# Patient Record
Sex: Male | Born: 1980 | Race: White | Hispanic: No | Marital: Married
Health system: Midwestern US, Community
[De-identification: ages and names within clinical notes are randomized; demographics above are authoritative.]

## PROBLEM LIST (undated history)

## (undated) DIAGNOSIS — R7303 Prediabetes: Secondary | ICD-10-CM

## (undated) DIAGNOSIS — E119 Type 2 diabetes mellitus without complications: Principal | ICD-10-CM

## (undated) DIAGNOSIS — M5126 Other intervertebral disc displacement, lumbar region: Secondary | ICD-10-CM

## (undated) DIAGNOSIS — R1013 Epigastric pain: Secondary | ICD-10-CM

## (undated) DIAGNOSIS — K219 Gastro-esophageal reflux disease without esophagitis: Secondary | ICD-10-CM

## (undated) DIAGNOSIS — M48062 Spinal stenosis, lumbar region with neurogenic claudication: Secondary | ICD-10-CM

## (undated) DIAGNOSIS — G4489 Other headache syndrome: Secondary | ICD-10-CM

## (undated) DIAGNOSIS — R42 Dizziness and giddiness: Secondary | ICD-10-CM

## (undated) DIAGNOSIS — Z0000 Encounter for general adult medical examination without abnormal findings: Secondary | ICD-10-CM

---

## 2008-09-20 NOTE — Progress Notes (Signed)
Subjective:      Patient ID: Bobby Hall is a 28 y.o. male.    HPI Comments: Bobby Hall is a 28 y.o. male who presents with cough and high fever.. Symptom onset has been acute for a time period of 4 day(s). Severity is described as moderate to severe. Course of his symptoms over time is acute.        Review of Systems   Constitutional: Positive for fever, chills, appetite change and fatigue.   HENT: Positive for ear pain and sneezing.    Eyes: Negative.    Respiratory: Positive for cough, chest tightness and shortness of breath. Negative for wheezing.    Cardiovascular: Positive for chest pain. Negative for palpitations.   Gastrointestinal: Negative for vomiting and diarrhea.   Genitourinary: Positive for decreased urine volume. Negative for dysuria, hematuria and difficulty urinating.   Musculoskeletal: Negative for myalgias.   Neurological: Positive for headaches. Negative for dizziness.   Hematological: Negative.    Psychiatric/Behavioral: Negative.        Objective:   Physical Exam   Constitutional: He appears well-developed and well-nourished.   HENT:   Head: Normocephalic.   Right Ear: External ear normal.   Left Ear: External ear normal.   Neck: Normal range of motion.   Cardiovascular: Normal rate and regular rhythm.    Pulmonary/Chest: He has decreased breath sounds in the right upper field and the right middle field. He has rales in the right middle field.   Abdominal: Soft.   Musculoskeletal: Normal range of motion.   Skin: Skin is warm.       Assessment:      Encounter Diagnosis   Name Primary?   ??? Pneumonia Yes       See above      Plan:      .        Encounter Diagnosis   Name Primary?   ??? Pneumonia Yes       X-ray for pneumonia. Given 7 tabs of avelox 400mg  1 QD

## 2008-09-28 LAB — AMYLASE: Amylase: 23 U/L — ABNORMAL LOW (ref 25–115)

## 2008-09-28 LAB — HEPATIC FUNCTION PANEL
ALT: 29 U/L (ref 10–40)
AST: 24 U/L (ref 15–37)
Albumin: 4.8 g/dL (ref 3.4–5.0)
Alkaline Phosphatase: 68 U/L
Bilirubin, Direct: 0.24 mg/dL (ref 0.0–0.3)
Bilirubin, Indirect: 0.6 mg/dL (ref 0.0–1.0)
Total Bilirubin: 0.8 mg/dL (ref 0.0–1.0)
Total Protein: 7.6 g/dL (ref 6.4–8.2)

## 2008-09-28 LAB — CBC
Hematocrit: 46.1 % (ref 40.5–52.5)
Hemoglobin: 15.6 g/dL (ref 13.5–17.5)
MCH: 29 pg (ref 26–34)
MCHC: 33.8 g/dL (ref 31–36)
MCV: 85.9 fl (ref 80–100)
MPV: 7.2 fl (ref 5.0–10.5)
Platelets: 230 10*3 (ref 135–450)
RBC: 5.37 10*6 (ref 4.2–5.9)
RDW: 13.7 % (ref 11.5–14.5)
WBC: 10.6 10*3 (ref 4.0–11.0)

## 2008-09-28 LAB — POCT URINALYSIS DIPSTICK
Bilirubin, UA: NEGATIVE
Blood, UA POC: NEGATIVE
Glucose, UA POC: NEGATIVE
Ketones, UA: NEGATIVE
Leukocytes, UA: NEGATIVE
Nitrite, UA: NEGATIVE
Spec Grav, UA: 1.01
Urobilinogen, UA: NEGATIVE
pH, UA: 6

## 2008-09-28 LAB — COMPREHENSIVE METABOLIC PANEL
ALT: 29 U/L (ref 10–40)
AST: 24 U/L (ref 15–37)
Albumin/Globulin Ratio: 1.8 (ref 1.1–2.2)
Albumin: 4.8 g/dL (ref 3.4–5.0)
Alkaline Phosphatase: 68 U/L
BUN: 12 mg/dL (ref 7–18)
CO2: 26 meq/L (ref 21–32)
Calcium: 9.9 mg/dL (ref 8.3–10.6)
Chloride: 106 meq/L (ref 99–110)
Creatinine: 1 mg/dL (ref 0.9–1.3)
GFR Est, African/Amer: >60
GFR, Estimated: >60 (ref >60)
Glucose: 119 mg/dL — ABNORMAL HIGH (ref 70–99)
Potassium: 4 meq/L (ref 3.5–5.1)
Sodium: 142 meq/L (ref 136–145)
Total Bilirubin: 0.8 mg/dL (ref 0.0–1.0)
Total Protein: 7.6 g/dL (ref 6.4–8.2)

## 2008-09-28 LAB — LIPASE: Lipase: 19.92 U/L (ref 5.6–51.3)

## 2008-09-28 NOTE — ED Notes (Unsigned)
[facility]     - [facility]  [address] [place] [id number]    EMERGENCY DEPARTMENT REPORT    NAME:                   Bobby Hall, Bobby Hall        ACCT NUMBER:                [id number]  GENDER:                 M                     PATIENT TYPE:             EMERG  AGE:                    28                    ROOM NUMBER:              ED  DATE OF BIRTH:          12/04/1980            MRN:                      [id number]      DISCHARGE DATE:         09/28/2008            FAMILY PHYS:              Bobby Hall, M.D.      CHIEF COMPLAINT:  Nausea, vomiting, and cephalgia.  HISTORY OF PRESENT ILLNESS:  The patient is a 28      -year-old white male,    who about 3 weeks  ago, had some diarrhea  ;   was diagnosed with a viral syndrome.  He had some   fluids .     T hen he  said he was [place] a [place] within the last couple of days , h e had developed some   c hest congestion at  times.  Denies any purulent sputum production.  He said he started feeling   sick.  He said he  started having a headache on Monday.  The patient states he has lost about 15   pounds from  vomiting this week.  Patient was on Avelox most recently.  Patient did have   Phenergan but he still  has been nauseated with this.  PAST MEDICAL HISTORY:  Remarkable for prior back surgery and hand surgery in   1995.  MEDICATIONS:  Vicodin and Phenergan.  ALLERGIES:  None.  SOCIAL HISTORY:  The patient is a no   nsmoker, denies alcohol excess, denies   any drugs of  abuse.  REVIEW OF SYSTEMS:  The patient complains of cephalgia, bifrontal, temporal in   nature and  retroorbital.  Denies cough or sputum production.  Denies sinus drainage.    Denies any sore  throat.    Denies  melena, hematochezia, or hematemesis.  All remaining   systems are reviewed by  me and are negative.  Nursing notes reviewed by me and are unremarkable.  PHYSICAL EXAMINATION:  HEENT:  The patient does have some photophobia.  He has no percussion   tenderness     to the  sinuses  however.  LUNGS:  Clear to P  and  A.  No E - to - A changes.  HEART:  Regular rate and rhythm without murmurs, gallops, or rubs.  ABDOMEN:  Soft.    Active bowel sounds times 4.  Nontender.  No masses,   guarding, rebound, or  peritoneal signs.  EXTRE   MITIES:  Unremarkable.  NEUROLOGIC:  No focal deficits or lateralizing signs.  BACK:  No thoracolumbar tenderness noted.  SKIN:  Warm and dry.  No cutaneous exanthem seen.  MENTAL STATUS:  Normal.  PSYCHIATRIC:  Normal.  MEDICAL DECISION MAKING/DI       AGNOSTIC DATA:  The patient's urinalysis is   negative.  No  bacteria, pyuria, or hematuria seen of significance.  LABORATORY DATA:        Shows a white count of 11,100 which is normal,   hemoglobin 15.9,  hematocrit 46.2, platelet count 214,000, which is also normal .   The patient   had glucose of   110 ,  BUN   11, creatinine   1.0.    Sodium is   139, potassium   4.1, chloride     104 , serum CO2   28 .  ED CLINICAL COURSE:       Unremarkable.  He was given  Zosyn  here, Dilaudid   and  Zofran .  He  feels better but he still has some cephalgia.   The p atient was given  normal   saline hydration   as  well.  FINAL DIAGNOSIS:     Acute cephalgia.  CRITICAL CARE:  None.  PROCEDURES:  None.  DISPOSITION:  The patient is discharged home   .  C ontinued on Augmentin   empirically, as well as  Zofran.  He has Vicodin at  home.  He is to follow up with his primary care   physician in the office in  the next 3 to 5 days or return if symptoms change or worsen.  Calton Golds, MD  electronic Signature  10/09/08 13:33      [facility]  -[facility]  [address] [place] [address]    EMERGENCY DEPARTMENT REPORT    NAME:              Bobby Hall, Bobby Hall NUMBER:          [id number]  GENDER:            M                 PATIENT TYPE:         EMERG  AGE:               28                ROOM NUMBER:          ED  DATE OF BIRTH:     1980-03-15        MRN:                  [id number]    Bobby Hall,  M.D.      Karene Fry Danie Binder  SOAP Job #:   [id number]  SOAP Document #:   [id number]  Dict:   09/28/2008 23:55:00  Tran:   09/29/2008 07:21:49

## 2008-09-28 NOTE — Progress Notes (Signed)
Pharmacy called [place] regarding prescription.

## 2008-09-28 NOTE — Patient Instructions (Addendum)
Clear liquid diet x 24 hrs, then BRAT  Vicodin for HA pain  Phenergan 25 mg tabs every 6 hrs as needed for nausea/vomiting  Blood work here today- we will call you with results Monday  Go to the Emergency Room for any worsening symptoms  Neti Pot at bedtime and as needed. Patient instructed on use of Neti Pot (Handout given)    Dehydration   GENERAL INFORMATION:   What is dehydration? Dehydration (dee-heye-DRAY-shen) is a condition that happens when the amount of water in the body is lower than normal. Normally, the body has the right amount of water inside and outside of the cells. Water and electrolytes (mineral salts) are usually in balance in the body. This balance is important to keep your body working properly. With dehydration, electrolyte levels may be increased or decreased. This may cause serious effects, such as your kidneys and other organs to not work properly.  What cause dehydration? Dehydration may be caused by not drinking enough water, losing too much fluid, or both. Any of the following may increase your chance of having dehydration:   ?? Advanced age with decreased ability to sense thirst or to concentrate urine.    ?? Being in the sun or heat for too long, or sweating a lot, such as when you exercise.    ?? Diseases, such as stroke, diabetes, heart problems, or infections.    ?? Medicines that cause you to lose water and salt, such as diuretics (water pills).    ?? Vomiting (throwing up), diarrhea, or fever that lasts a long time.   What are the signs and symptoms of dehydration? You may have any of the following:   ?? Dry eyes or mouth.    ?? Headache, dizziness, or confusion (cannot think clearly).    ?? Increased thirst.    ?? Irregular or fast breathing, fast or pounding heartbeat, and low blood pressure.    ?? Passing little or no urine, or constipation (dry, hard bowel movement).    ?? Sudden weight loss.    ?? Tiredness or body weakness.   How is dehydration diagnosed? Your caregiver will take a  detailed health history from you. This includes information on other health conditions you have that may lead to dehydration. Caregivers may also ask about past surgeries, or the medicines that you use or have used. You may need any of the following:   ?? Physical exam: Your caregiver will do a complete check-up of your body to look for problems or signs of infection.     ?? Blood tests: You may need blood taken for tests. The blood can be taken from a blood vessel in your hand, arm, or the bend in your elbow. It is tested to see how your body is doing. It can give your caregivers more information about your health condition. You may need to have blood drawn more than once.     ?? BUN and creatinine: Caregivers will measure the levels of blood urea nitrogen (BUN) and creatinine in your body. This will tell caregivers if too much water is being lost or retained in your body.    ?? Tilt table test: This test checks to see what happens to your heartbeat and your blood pressure when you change positions.    ?? Urinalysis: This test that looks at your urine to find out how your kidneys are working. Caregivers look for white blood cells, protein, sugar, blood, and bacteria that are not normally in urine.  They may also check the amount of water in your body by checking the concentration of your urine.   How is dehydration treated? You may have any of the following:   ?? Oral rehydration therapy: This therapy replaces lost body fluids by having you drink plenty of liquids to avoid dehydration. You may also drink an oral rehydration solution (ORS). An ORS has the right amounts of water, salts, and sugar you need to replace body fluids.    ?? Intravenous therapy: You may receive fluid through a tube placed in your vein. Lost electrolytes (mineral salts) may also be included in the fluid.    ?? Hypodermoclysis: This is also called clysis. This treatment quickly gives your body a large amount of water. The water is given into the  deepest layer of your skin, which helps regulate body temperature.    ?? Other treatments: Treating other conditions, which may have caused your dehydration, is also important. If you are using medicines that put you at risk of dehydration, they may have to be stopped. An electrolyte imbalance may also be corrected.   The earlier dehydration is treated, the better your chance of recovery. With treatment, such as fluid replacement, you may fully recover from dehydration.   Where can I find more information? Contact the following for more information:   ?? [employer]  [address] [address]  [place], [place] [address]  Phone: [phone]  Web Address: http://www.https://powers.com/  CARE AGREEMENT:   You have the right to help plan your care. To help with this plan, you must learn about your health condition and how it may be treated. You can then discuss treatment options with your caregivers. Work with them to decide what care may be used to treat you. You always have the right to refuse treatment.   Copyright ?? 2009. [employer]. All rights reserved. Information is for End User's use only and may not be sold, redistributed or otherwise used for commercial purposes.  The above information is an educational aid only. It is not intended as medical advice for individual conditions or treatments. Talk to your doctor, nurse or pharmacist before following any medical regimen to see if it is safe and effective for you.Acute Headache   GENERAL INFORMATION:   What is a headache? A headache is pain or discomfort in the head. A headache may have a known cause, or the cause may be unknown. Many people get headaches. Both men and women can get headaches, but women more often have headaches that are moderately or very painful. Headaches occur most often in middle aged adults, and less often in older adults.  What are the most common types of headaches?   ?? Tension-type headache: This is the most common type of  headache. These headaches may occur during the day, typically in the late afternoon, and may go away by the evening. The pain is usually mild or moderate, and you may have problems tolerating bright light or loud noise. The pain is usually felt across the forehead or in the back of the head, and it may be only on one side. These headaches may occur every day.     ?? Migraine headache: Migraine headaches are usually recurring and cause moderate or severe pain. The headaches generally last from 1 to 3 days. Pain tends to be on only one side of the head, and may change sides. The pain is often located in the temple or the back of the head,  and it may feel throbbing. The pain may also be behind your eye, and feel sharp and steady.    ?? Migraine with aura: An aura is something that you see or feel, and occurs before a headache. People with an aura before their migraine headache often see a small spot surrounded by bright zigzag lines. Other signs or symptoms may follow the aura.     ?? Cluster headache: The pain of a cluster headache is usually on one side of the head. It often causes severe pain, and can last for 30 minutes to two hours. These headaches may occur once or twice each day. These headaches occur more often at night, and may wake you from sleeping.   What causes headaches? Headaches may be a symptom of a wide variety of conditions including the following:   ?? Infections: This includes infections caused by a virus, bacteria or other germ.    ?? Metabolic disorders: This includes thyroid gland and other disorders.     ?? Neoplasms: These are also called growths or tumors.    ?? Posttraumatic disorders: After head trauma, such as a blow to the head, a headache may occur.    ?? Primary headache disorders: These problems may be from muscle contractions or changes in the way that your body feels, controls, or responds to pain.    ?? Structural disorders: These problems may involve nerves such as cranial nerves or the  trigeminal nerve. Meninges, blood vessels, muscles, soft tissues, skin, bones, or joints may also cause headaches. Tooth, neck or sinus pain may also cause headaches.    ?? Toxins: Being around strong-smelling chemicals or other substances may cause headaches.   Other factors that may cause or be related to headaches include:   ?? Caffeine: If you are used to drinking 2 to 3 cups of coffee each day, suddenly stopping may cause a headache.     ?? Diet: Cured meats, artificial sweeteners, products that contain tyramine (such as red wine, aged cheese, and dark chocolate), and monosodium glutamate (MSG) may trigger headaches.    ?? Drinking alcohol: Drinks that contain alcohol such as beer, wine, vodka and other hard liquor may cause headaches.    ?? Fasting: Not eating for a period of time may cause a headache.     ?? Fatigue: Tiredness can cause tension-type headaches to occur.     ?? Menstruation cycles: Menstruation may cause headaches, especially for teenagers or after a pregnancy. Headaches are also more likely when you use oral contraceptives (birth control pills) or estrogen replacement therapy.    ?? Sleep deprivation: Not getting enough sleep can cause tension-type headaches, and changes in your usual sleep pattern can cause a migraine headache. Napping during the daytime may also cause headaches.    ?? Stress: Tension or stress may cause headaches. Migraine headaches caused by stress may go away after relaxing. Headaches caused by stress may occur hours or even days after stressful events.   How can I tell caregivers about my headache?   ?? A pain scale may be used to help you tell caregivers how bad the pain is.              ?? Headaches may be acute, lasting for hours or days. Headaches may also be subacute, lasting for days or weeks. Headaches may also be chronic, lasting for months or years. Headaches can occur in one or more places, anywhere in the head.    ?? Headaches may be  mild or moderately painful. It may be  hard to describe exactly where the pain is, and the headache may feel like pressure in or on your head. These headaches are usually caused by muscle problems such as contractions (cramps) or injuries.    ?? Headaches may be moderately or very painful. You may be able to state exactly where the pain is. These headaches usually are behind your eye or in your forehead, and are a throbbing pain. This type of headache is usually caused by problems with blood vessels in the head.   What are signs and symptoms that may be related to my headache?   ?? Fever.    ?? Loss of memory, responses to events that seem incorrect, and trouble solving easy mathematical questions.     ?? Nausea (feeling sick to your stomach) and vomiting (throwing up).    ?? Problems with your vision:    ?? Drooping eyelids, watery eyes, or red eyes.     ?? Losing your ability to see to the sides, up or down.    ?? Seeing strange designs or sights right before the headache occurs.    ?? Trouble tolerating bright light.     ?? Unable to see as clearly or as well as usual.     ?? Runny nose.     ?? Stiff neck.    ?? Tenderness of the head and neck area.     ?? Trouble staying awake, or being less alert than usual.     ?? Weakness or decreased energy.   How might my headache be treated?   ?? Treatment for a headache depends on the cause of the headache, if the cause is known. If there is a medical condition causing your headache, treatment will be aimed at that condition. If the cause of your headache is unknown, treatment is aimed at stopping or preventing the headache.    ?? Treatment to stop the headache may be done for headaches that cause moderate or severe pain. OTC pain medicine may be suggested for treatment of occasional tension-type headaches. If headaches greatly impact your daily activities, certain treatments do not help, and for other factors, you may need one or more of the following treatments to prevent or treat a headache:    ?? Analgesics: This medicine  may be used to treat tension-type headaches.     ?? Antidepressants: This medicine may help decrease how often and how long you have tension-type headaches.    ?? Biofeedback: Biofeedback is a special way to control how your body reacts to things like stress or pain. The first step in this training is to use electrodes (wires) to monitor your body responses. These electrodes are placed on different parts of your body, such as your chest. The electrodes are attached to a TV-type monitor which gives a paper tracing of your heart beating. You will learn how to control body changes, such as slowing your heart rate, when you become upset. Thermal biofeedback measures heat. When this is used with relaxation, certain headaches may be prevented. Electromyographic biofeedback measures muscle tension. This treatment may be used with other treatments to prevent headaches.    ?? Cognitive - behavior therapy: Also called stress management, this therapy may be used with other measures to prevent headaches.   What can I do to help prevent or treat my headaches?   ?? Applying heat to the area increases blood circulation and may relieve a headache. Place a warm compress  or a heating pad on the painful area. A warm compress is a small towel dampened with hot water and placed in a plastic bag. Wrap a towel around the plastic bag to prevent burns. Keep a heating pad on a low heat setting. Do not sleep on a heating pad because it can cause a bad burn.    ?? You may want to keep a headache diary. Record the dates and times that you get headaches, and what you were doing before the headache started. This might help you learn if there is something that triggers your headaches.    ?? Try lying down in a comfortable position and closing your eyes. Relax your muscles slowly, starting at the toes and working your way up your body.   Will I need appointments with other caregivers?   ?? You may need to see a dentist for a temporomandibular joint  condition (TMD). If you are an older adult and temporal arteritis is suspected, you may need a procedure called a temporal artery biopsy. You may need to see an ophthalmologist if headaches occur after close-up work such as reading, or if your headaches may be caused by another condition related to your eyesight.    ?? You may need to see a psychiatrist if you have anxiety or depression, or a gynecologist if you use birth control pills or estrogen replacement therapy. If sudden and serious conditions that may require surgery are suspected, you may need to see a neurosurgeon. You may need to see a neurologist if you have other symptoms with your headache, or caregivers find problems during an exam. If you have long-lasting or long-term headaches or treatment does not work to prevent or relieve your headaches, you may need to see a headache specialist.   What should I expect with time or treatment? Relief from headaches depends on the type of headache, treatments used, and other factors. Migraine headaches may improve after age 78. Headaches that occur in relation to the menstrual cycle may be less likely to improve over time. Headaches caused by conditions such as temporal arteritis and subdural hematoma may slowly improve. There may be lasting symptoms such as vision loss or memory loss if headaches are caused by a subdural hematoma or other medical condition.   Call your caregiver if you have any of the following:   ?? A continuous headache with vomiting and signs of dehydration.    ?? A daily headache with pain that is not relieved by medicine and other treatments.     ?? Changes in your headaches, or new symptoms that occur when you have a headache.    ?? Headaches after having surgery, or after being told that you have another medical condition.    ?? Severe headache pain that does not go away even after using medicine.   Seek immediate help by calling 911 if you have any of the following:   ?? A headache that occurs  after a blow to the head, a fall, or other trauma.     ?? Changes in personality, forgetfulness, or other mental changes with your headache.    ?? Loss of feeling on one side of your face or body.    ?? Severe headache, neck stiffness, vomiting and photophobia (pain with bright light).     ?? Unbearable headache pain.   CARE AGREEMENT:   You have the right to help plan your care. To help with this plan, you must learn about your health condition  and how it may be treated. You can then discuss treatment options with your caregivers. Work with them to decide what care may be used to treat you. You always have the right to refuse treatment.   Copyright ?? 2009. [employer]. All rights reserved. Information is for End User's use only and may not be sold, redistributed or otherwise used for commercial purposes.  The above information is an educational aid only. It is not intended as medical advice for individual conditions or treatments. Talk to your doctor, nurse or pharmacist before following any medical regimen to see if it is safe and effective for you.

## 2008-09-28 NOTE — Progress Notes (Signed)
Subjective:      Patient ID: Bobby Hall is a 28 y.o. male.    HPI  States had stomach flu x 3 weeks ago with diarrhea and became so dehydrated he had to go get IV fluids. Reports Nausea and vomiting started x 5 days ago associated with HA. Thought it would get better, but did not. 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   Review of Systems   Constitutional: Negative.  Negative for fever and chills.   HENT: Positive for sinus pressure. Negative for sore throat.    Eyes: Negative.    Respiratory: Negative.  Negative for cough.    Cardiovascular: Negative.    Gastrointestinal: Positive for nausea, abdominal pain and constipation. Negative for diarrhea (not lately).   Genitourinary: Negative.    Musculoskeletal: Negative.    Skin: Negative.  Negative for rash.   Neurological: Positive for light-headedness and headaches. Negative for dizziness and numbness.   Hematological: Negative.    Psychiatric/Behavioral: Negative.        Objective:   Physical Exam   Nursing note and vitals reviewed.  Constitutional: He is oriented to person, place, and time. He appears well-developed and well-nourished.  Non-toxic appearance. He does not have a sickly appearance. He appears ill. He appears distressed.   HENT:   Right Ear: Tympanic membrane is not injected and not erythematous. A middle ear effusion is present.   Left Ear: Tympanic membrane is not injected and not erythematous. A middle ear effusion is present.   Nose: Right sinus exhibits frontal sinus tenderness. Right sinus exhibits no maxillary sinus tenderness. Left sinus exhibits frontal sinus tenderness. Left sinus exhibits no maxillary sinus tenderness.   Mouth/Throat: Oropharyngeal exudate present. No posterior oropharyngeal edema or posterior oropharyngeal erythema.   Cardiovascular: Normal rate and regular rhythm.    Pulmonary/Chest: Effort normal and breath sounds normal.   Abdominal: Soft. Bowel sounds are increased.  Tenderness is present in the left lower quadrant. He has no rigidity, no rebound and no CVA tenderness.   Musculoskeletal: Normal range of motion.   Neurological: He is alert and oriented to person, place, and time.   Skin: Skin is warm, dry and intact.   Psychiatric: He has a normal mood and affect.       Assessment:      1. Dehydration (276.51)  CBC, COMPREHENSIVE METABOLIC PANEL, HEPATIC FUNCTION PANEL, POCT URINALYSIS DIPSTICK   2. HEADACHE (784.0)  CBC, COMPREHENSIVE METABOLIC PANEL   3. Nausea & Vomiting (787.01H)  CBC, COMPREHENSIVE METABOLIC PANEL, AMYLASE, LIPASE   4. LLQ Abdominal Pain (789.04C)  CBC, COMPREHENSIVE METABOLIC PANEL, HEPATIC FUNCTION PANEL, POCT URINALYSIS DIPSTICK, AMYLASE, LIPASE               Plan:      Clear liquid diet x 24 hrs, then BRAT  Vicodin for HA pain  Phenergan 25 mg tabs every 6 hrs as needed for nausea/vomiting  Blood work here today- we will call you with results Monday  Go to the Emergency Room for any worsening symptoms  Neti Pot at bedtime and as needed. Patient instructed on use of Neti Pot (Handout given)

## 2008-10-02 ENCOUNTER — Telehealth

## 2008-10-02 NOTE — Telephone Encounter (Signed)
Continues to have dizziness ,head and ears feeling full. Not able to stay up for long periods of time due to this dizziness. Patient spoke to Dr. Selena Batten. Told to increase fluids, [facility] [place] [facility] per verbal order of Dr. Selena Batten.

## 2008-10-26 LAB — POCT URINALYSIS DIPSTICK
Bilirubin, UA: NEGATIVE
Blood, UA POC: NEGATIVE
Glucose, UA POC: NEGATIVE
Ketones, UA: NEGATIVE
Leukocytes, UA: NEGATIVE
Nitrite, UA: NEGATIVE
Spec Grav, UA: 1.02
Urobilinogen, UA: 0.2
pH, UA: 6

## 2008-10-26 NOTE — Patient Instructions (Signed)
Verbal instructions given as follows:    Lab work  F/U GI Referral (Dr. Josem Kaufmann)  Albuterol as needed (Rx given)  Pulmonary Function Test -(Rx given)  Naproxen 500 mg twice a day with meals (Rx given)

## 2008-10-26 NOTE — Progress Notes (Signed)
Subjective:      Patient ID: Bobby Hall is a 28 y.o. male.    Chest Pain   This is a new problem. The current episode started 1 to 4 weeks ago (x 3 weeks ago. Thought pulled a muscle and that it would go away- it hasn't. States cleaned out a corn silo with mold on 09-14-08 and hasn't been right since.). The onset quality is sudden. The problem occurs constantly. The problem has been gradually worsening. The pain is present in the lateral region. The pain is at a severity of 5/10. The pain is moderate. The quality of the pain is described as stabbing. The pain radiates to the right shoulder. Associated symptoms include abdominal pain (after eating), a cough, diaphoresis (night sweats), exertional chest pressure, malaise/fatigue, nausea, near-syncope, shortness of breath and weakness. Pertinent negatives include no back pain, dizziness, fever, headaches, hemoptysis, irregular heartbeat, orthopnea, palpitations, sputum production, syncope or vomiting. The cough's precipitants include allergen exposure (mold from cleaning out the silo?). The cough is non-productive. Cough relieved by: breathing treatment of albuterol.       Review of Systems   Constitutional: Positive for malaise/fatigue, diaphoresis (night sweats), appetite change and fatigue. Negative for fever and chills.   HENT: Negative.  Negative for ear pain, congestion, rhinorrhea and sinus pressure.    Eyes: Negative.    Respiratory: Positive for cough, chest tightness and shortness of breath. Negative for hemoptysis and sputum production.    Cardiovascular: Positive for chest pain. Negative for palpitations, orthopnea and syncope.   Gastrointestinal: Positive for nausea and abdominal pain (after eating). Negative for vomiting, diarrhea (loose stools) and constipation.        "Smelly burps"     Genitourinary: Negative.    Musculoskeletal: Negative.  Negative for back pain.   Skin: Negative.  Negative for rash.   Neurological: Positive for weakness and  light-headedness. Negative for dizziness and headaches.   Hematological: Negative.  Negative for adenopathy.   Psychiatric/Behavioral: Negative.  Negative for sleep disturbance.   All other systems reviewed and are negative.        Objective:   Physical Exam   Nursing note and vitals reviewed.  Constitutional: He appears well-developed and well-nourished. He does not appear ill. He appears distressed.   HENT:   Head: Normocephalic.   Right Ear: Tympanic membrane is not injected and not erythematous. A middle ear effusion (R>L) is present.   Left Ear: Tympanic membrane is not injected and not erythematous. A middle ear effusion is present.   Nose: No rhinorrhea. Right sinus exhibits no maxillary sinus tenderness and no frontal sinus tenderness. Left sinus exhibits no maxillary sinus tenderness and no frontal sinus tenderness.   Cardiovascular: Normal rate, regular rhythm and normal heart sounds.    Pulmonary/Chest: Effort normal. No respiratory distress. He has decreased breath sounds. He has no wheezes. He has no rhonchi. He has no rales. He exhibits tenderness (right sided).          Abdominal: Soft. Normal appearance and bowel sounds are normal. There is no organomegaly. Tenderness is present in the left lower quadrant. He has no rebound, no guarding and no CVA tenderness. No hernia.   Lymphadenopathy:     He has no cervical adenopathy.     He has no axillary adenopathy.        Right axillary: No pectoral and no lateral adenopathy present.        Left axillary: No pectoral and no lateral adenopathy present.  Assessment:      1. Chest Pain (786.7F)  PULMONARY FUNCTION TEST   2. Cough (786.2)  albuterol (PROVENTIL;VENTOLIN) 90 MCG/ACT inhaler   3. Abdominal Pain (789.00AP)  AMB REFERRAL TO GASTROENTEROLOGY, CBC, COMPREHENSIVE METABOLIC PANEL, POCT URINALYSIS DIPSTICK   4. Fatigue (780.79B)  TSH   5. Lipid Screening (V77.91D)  LIPID PANEL   6. Screening PSA (Prostate Specific Antigen) (V76.44C)  PSA SCREENING              Plan:      Lab work  F/U GI Referral (Dr. Josem Kaufmann)  Albuterol as needed (Rx given)  Pulmonary Function Test - Rx given  Naproxen 500 mg twice a day with meals

## 2008-10-27 LAB — COMPREHENSIVE METABOLIC PANEL
ALT: 37 U/L (ref 10–40)
AST: 25 U/L (ref 15–37)
Albumin/Globulin Ratio: 1.7 (ref 1.1–2.2)
Albumin: 4.5 g/dL (ref 3.4–5.0)
Alkaline Phosphatase: 57 U/L
BUN: 9 mg/dL (ref 7–18)
CO2: 25 meq/L (ref 21–32)
Calcium: 10 mg/dL (ref 8.3–10.6)
Chloride: 105 meq/L (ref 99–110)
Creatinine: 0.9 mg/dL (ref 0.9–1.3)
GFR Est, African/Amer: >60
GFR, Estimated: >60 (ref >60)
Glucose: 89 mg/dL (ref 70–99)
Potassium: 3.8 meq/L (ref 3.5–5.1)
Sodium: 142 meq/L (ref 136–145)
Total Bilirubin: 0.8 mg/dL (ref 0.0–1.0)
Total Protein: 7.2 g/dL (ref 6.4–8.2)

## 2008-10-27 LAB — CBC
Hematocrit: 44.6 % (ref 40.5–52.5)
Hemoglobin: 15 g/dL (ref 13.5–17.5)
MCH: 29.4 pg (ref 26–34)
MCHC: 33.6 g/dL (ref 31–36)
MCV: 87.7 fl (ref 80–100)
MPV: 8.7 fl (ref 5.0–10.5)
Platelets: 191 10*3 (ref 135–450)
RBC: 5.09 10*6 (ref 4.2–5.9)
RDW: 14.6 % — ABNORMAL HIGH (ref 11.5–14.5)
WBC: 5.7 10*3 (ref 4.0–11.0)

## 2008-10-27 LAB — LIPID PANEL
Cholesterol, Total: 177 mg/dl (ref <200)
HDL: 39 mg/dL — ABNORMAL LOW (ref 40–60)
LDL Calculated: 119 mg/dl — ABNORMAL HIGH (ref <100)
Triglycerides: 97 mg/dl (ref <150)
VLDL Cholesterol Calculated: 19 mg/dl

## 2008-10-27 LAB — PSA SCREENING: PSA: 0.89 ng/ml

## 2008-10-27 LAB — TSH: TSH: 3.21 u[IU]/mL (ref 0.35–5.5)

## 2008-11-08 NOTE — Progress Notes (Signed)
Subjective:      Patient ID: Bobby Hall is a 28 y.o. male.    HPI Comments: Bobby Hall is a 28 y.o. male who presents with chest pain sharp and sensitive swollen. Symptom onset has been acute for a time period of 2 week(s). Severity is described as severe. Course of his symptoms over time is acute and abrupt.no injury and sign of infection..        Review of Systems   Constitutional: Negative.  Negative for fever.   HENT: Positive for ear pain.    Eyes: Negative.    Respiratory: Positive for shortness of breath. Negative for cough and wheezing.    Cardiovascular: Positive for chest pain.   Gastrointestinal: Negative.    Genitourinary: Negative.    Musculoskeletal: Positive for back pain.   Skin: Negative.    Neurological: Negative.    Hematological: Negative.    Psychiatric/Behavioral: Negative.        Objective:   Physical Exam   Constitutional: He appears well-developed and well-nourished.   HENT:   Mouth/Throat: No oropharyngeal exudate.   Eyes: Conjunctivae and extraocular motions are normal. Pupils are equal, round, and reactive to light. No scleral icterus.   Neck: Normal range of motion. No thyromegaly present.   Cardiovascular: Normal rate, regular rhythm and intact distal pulses.    Pulmonary/Chest: Effort normal. He has no wheezes. He exhibits tenderness.   Abdominal: Soft. He exhibits no mass. No tenderness.   Musculoskeletal: Normal range of motion. He exhibits no edema.   Lymphadenopathy:     He has no cervical adenopathy.   Neurological: He is alert. No cranial nerve deficit.   Skin: Skin is warm.   Psychiatric: His behavior is normal.       Assessment:      Encounter Diagnosis   Name Primary?   . Chest wall pain Yes             Plan:      Pain medicines for 2 weeks. Chest and rib x-rays if not better in 2-3 weeks.

## 2008-12-11 NOTE — Progress Notes (Signed)
Subjective:      Patient ID: Bobby Hall is a 28 y.o. male.    HPI Comments: Bobby Hall is a 28 y.o. male who presents with pain rt peri and infla scapular are.. Symptom onset has been chronic for a time period of 3 month(s). Severity is described as moderate to severe. Course of his symptoms over time is chronic and abrupt. Burning and sharp pain radiating to front. Pain is not worse with movements.referral to last 2 visits..        Review of Systems   Constitutional: Negative.    HENT: Negative for hearing loss, ear pain and sinus pressure.    Eyes: Negative.    Respiratory: Negative.  Negative for apnea, cough, choking and stridor.    Cardiovascular: Negative.    Gastrointestinal: Negative.    Genitourinary: Negative.  Negative for hematuria and penile swelling.   Musculoskeletal: Negative for back pain.   Skin: Negative.  Negative for rash, color change and pallor.        Rt side of mid of back is numb and loss of sense. More dull to pin pricking and touch.   Neurological: Negative.  Negative for dizziness, tremors, seizures and syncope.   Hematological: Negative.  Does not bruise/bleed easily.   Psychiatric/Behavioral: Negative for confusion, dysphoric mood and decreased concentration.       Objective:   Physical Exam   Constitutional: He appears well-developed and well-nourished.   HENT:   Mouth/Throat: No oropharyngeal exudate.   Eyes: Conjunctivae and extraocular motions are normal. Pupils are equal, round, and reactive to light. No scleral icterus.   Neck: Normal range of motion. No thyromegaly present.   Cardiovascular: Normal rate, regular rhythm and intact distal pulses.    Pulmonary/Chest: Effort normal. He has no wheezes. He exhibits no tenderness.   Abdominal: Soft. He exhibits no mass. No tenderness.   Musculoskeletal: Normal range of motion. He exhibits no edema.        Arms:    Lymphadenopathy:     He has no cervical adenopathy.   Neurological: He is alert. No cranial nerve deficit.      Skin: Skin is warm.   Psychiatric: His behavior is normal.       Assessment:      Encounter Diagnosis   Name Primary?   . Radicular neuropathy Yes             Plan:      See above.

## 2008-12-24 NOTE — Telephone Encounter (Signed)
Referred to neurologist for rt side radiculopathy, but other side finding of MRI.

## 2008-12-24 NOTE — Telephone Encounter (Signed)
Patient is asking for the results of his MRI done on 12-19-2008 at [facility].  Please call patients wife(Bobby Hall) [phone] ext [phone].  Below are the results.      MRI THORACIC SPINE WITHOUT CONTRAST   INDICATION- RADICULOPATHY   TECHNIQUE- Multiplaner, multisequence MR images were obtained   without contrast.   COMPARISON- None   FINDINGS-   The vertebral levels are labeled on the T2 sagittal sequence.   There is normal height, alignment, and signal intensity of the   vertebral bodies. There is normal signal and configuration of the   visualized spinal cord. The visualized soft tissues surrounding   the spine are unremarkable.   At the T3-T4 level a small left paracentral disc protrusion   results in mild left paracentral canal stenosis. A focus of high   T2 signal within the disc protrusion suggests an annular tear.   No other disc herniation or spinal stenosis is identified.   IMPRESSION-   Small left paracentral disc protrusion at T3-T4 which results in   mild left paracentral canal stenosis.   Read ByIdelle Jo M.D.   Released By- Idelle Jo M.D.   Released Date Time- 12/19/08 0981   Transcriptionist- Riki Rusk D Pete Pelt.D.

## 2008-12-25 NOTE — Telephone Encounter (Signed)
Patient is requesting his pain medication called to [facility] [place].  He is actually requesting for something stronger.  He has appointment with Dr. Kizzie Bane (which I faxed over MRI results).

## 2008-12-25 NOTE — Telephone Encounter (Signed)
Give him vicodin for pain. Not percocet. Can have 30 tabs till sees Dr. Kizzie Bane.

## 2008-12-25 NOTE — Telephone Encounter (Signed)
Called [facility] [place]  PATIENT HAS REFILLS AT PHARMACY.

## 2009-01-24 NOTE — Procedures (Unsigned)
PATIENT NAME                    PA #              MR #               Bobby Hall, [place]                 [id number]        [id number]         ATTENDING PHYSICIAN                      DATE         DIS DATE        Glee Arvin, MD                       01/23/2009   01/23/2009      DATE OF BIRTH      AGE             PATIENT TYPE       RM #            10-22-1980         28              OPQ                                   REFERRING PHYSICIAN:  Dr. Glee Arvin and Dr. Anselm Pancoast.     Routine spirometry indicates that there is no significant  obstruction/limitation to airflow present in the large airways with a normal  FEV1/FVC ratio of 82%.  The absolute FEV1 of 5.4 L is normal at 108% of  predicted and the forced vital capacity of 6.6 L is normal at 107% of  predicted.  There is no change in airflow following bronchodilators.  Overall  ventilatory reserve as reflected in the MBV is normal at 95% of predicted.   The patient's static lung volumes are normal with a total lung capacity of 8  L which is 99% of predicted.  There are marked obesity related reduction and  selected static lung volumes.  Gas exchange inefficiency as reflected in the  diffusion capacity is normal at 106% of predicted.     OVERALL IMPRESSION:  These are completely normal pulmonary function studies  with no obstructive or restrictive pulmonary defects, normal ventilatory  reserve, normal gas exchange inefficiency, and no change in airflow following  bronchodilators.  There are marked obesity induced changes in selected static  lung volumes.                                Romilda Joy, MD     [id number]  DD: 01/24/2009 07:58  DT: 01/24/2009 09:10  Job #: [id number]  CC: Anselm Pancoast, MD  CC: Glee Arvin, MD

## 2009-03-01 LAB — POCT URINALYSIS DIPSTICK
Bilirubin, UA: NEGATIVE
Blood, UA POC: NEGATIVE
Glucose, UA POC: NEGATIVE
Ketones, UA: NEGATIVE
Leukocytes, UA: NEGATIVE
Nitrite, UA: NEGATIVE
Protein, UA POC: NEGATIVE
Spec Grav, UA: 1035
Urobilinogen, UA: 0.2
pH, UA: 6

## 2009-03-01 NOTE — Progress Notes (Signed)
Here for DOT physical exam. No complaints. Takes Vicodin as needed for back pain issues (herniated discs with surgery) prescribed by Dr. 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  See scanned DOT PE form.  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

## 2010-08-19 NOTE — Progress Notes (Signed)
Subjective:      Patient ID: Bobby Hall is a 30 y.o. male.    Otalgia   There is pain in both (lt is worse) ears. This is a new problem. The current episode started in the past 7 days. The problem occurs constantly. The problem has been gradually worsening. There has been no fever. The pain is moderate. Pertinent negatives include no ear discharge, headaches, hearing loss, rhinorrhea or vomiting. He has tried nothing for the symptoms. The treatment provided no relief. There is no history of hearing loss.       Review of Systems   Constitutional: Positive for fatigue. Negative for fever.   HENT: Positive for ear pain and voice change. Negative for hearing loss, rhinorrhea, sinus pressure and ear discharge.         [Started voice changes first and ear ache.  Eyes: Negative.    Respiratory: Negative.  Negative for apnea, choking and stridor.    Cardiovascular: Negative.    Gastrointestinal: Negative for nausea and vomiting.   Genitourinary: Negative for hematuria, discharge and penile swelling.   Musculoskeletal: Negative for myalgias and back pain.   Skin: Negative for color change and pallor.   Neurological: Negative for dizziness, tremors, seizures, syncope and headaches.   Hematological: Does not bruise/bleed easily.   Psychiatric/Behavioral: Negative for confusion, dysphoric mood and decreased concentration.       Objective:   Physical Exam   Constitutional: He appears well-developed and well-nourished.   HENT:   Mouth/Throat: No oropharyngeal exudate.         full of wax lt ear   Eyes: Conjunctivae and EOM are normal. Pupils are equal, round, and reactive to light. No scleral icterus.   Neck: Normal range of motion. No thyromegaly present.        Lt side cervical l-n is tender.   Cardiovascular: Normal rate, regular rhythm and intact distal pulses.    Pulmonary/Chest: Effort normal. He has no wheezes. He exhibits no tenderness.   Abdominal: Soft. He exhibits no mass. There is no tenderness.    Musculoskeletal: Normal range of motion. He exhibits no edema.   Lymphadenopathy:     He has cervical adenopathy.   Neurological: He is alert. No cranial nerve deficit.   Skin: Skin is warm.   Psychiatric: His behavior is normal.       Assessment:      Encounter Diagnosis   Name Primary?   ??? Cerumen impaction Yes           Plan:      Removed wax from lt ear with syringed water.

## 2010-09-23 NOTE — Patient Instructions (Signed)
Low fat diet.

## 2010-09-23 NOTE — Telephone Encounter (Signed)
i have ordered protonix instead of nexium.

## 2010-09-23 NOTE — Progress Notes (Signed)
Subjective:      Patient ID: Bobby Hall is a 30 y.o. male.    GI Problem  The primary symptoms include fatigue, abdominal pain, nausea and diarrhea. Primary symptoms do not include fever, weight loss, vomiting, melena, hematemesis or hematochezia. Episode onset: more than 6 m ago. The problem has been gradually worsening.   The fatigue began more than 1 week ago. The fatigue has been worsening since its onset.   The abdominal pain has been gradually worsening since its onset. The abdominal pain is located in the epigastric region. The abdominal pain radiates to the epigastric region. The abdominal pain is relieved by nothing.   The illness is also significant for back pain. Associated medical issues do not include liver disease, alcohol abuse or bowel resection.       Review of Systems   Constitutional: Positive for fatigue. Negative for fever and weight loss.   HENT: Negative for hearing loss, ear pain and sinus pressure.    Eyes: Negative.    Respiratory: Negative.  Negative for apnea, choking and stridor.    Cardiovascular: Negative.    Gastrointestinal: Positive for nausea, abdominal pain and diarrhea. Negative for vomiting, melena, hematochezia and hematemesis.   Genitourinary: Negative.  Negative for hematuria, discharge and penile swelling.   Musculoskeletal: Positive for back pain.   Skin: Negative for color change and pallor.   Neurological: Negative.  Negative for dizziness, tremors, seizures and syncope.   Hematological: Does not bruise/bleed easily.   Psychiatric/Behavioral: Negative for confusion, sleep disturbance, dysphoric mood and decreased concentration. The patient is not nervous/anxious.        Objective:   Physical Exam   Constitutional: He appears well-developed and well-nourished.   HENT:   Mouth/Throat: No oropharyngeal exudate.   Eyes: Conjunctivae and EOM are normal. Pupils are equal, round, and reactive to light. No scleral icterus.   Neck: Normal range of motion. No thyromegaly  present.   Cardiovascular: Normal rate, regular rhythm and intact distal pulses.    Pulmonary/Chest: Effort normal. He has no wheezes. He exhibits no tenderness.   Abdominal: Soft. He exhibits no mass. There is tenderness.        Epigastric tenderness.   Musculoskeletal: Normal range of motion. He exhibits no edema.   Lymphadenopathy:     He has no cervical adenopathy.   Neurological: He is alert. No cranial nerve deficit.   Skin: Skin is warm.   Psychiatric: His behavior is normal.       Assessment:      Encounter Diagnoses   Name Primary?   . Gallbladder stone with nonacute cholecystitis Yes   . GERD (gastroesophageal reflux disease)    . IBS (irritable bowel syndrome)            Plan:      See above.

## 2010-09-23 NOTE — Telephone Encounter (Signed)
[facility] [place] states NEXIUM 40 mg one daily needs PA.  [facility] [phone]  Plan states to try with Omeprazole or Pantoprazole.  Please advise.

## 2010-12-04 LAB — POCT URINALYSIS DIPSTICK
Bilirubin, UA: NEGATIVE
Blood, UA POC: NEGATIVE
Glucose, UA POC: NEGATIVE
Ketones, UA: NEGATIVE
Leukocytes, UA: NEGATIVE
Nitrite, UA: NEGATIVE
Protein, UA POC: NEGATIVE
Spec Grav, UA: 1.01
Urobilinogen, UA: NEGATIVE
pH, UA: 7

## 2010-12-04 LAB — COMPREHENSIVE METABOLIC PANEL
ALT: 69 U/L — ABNORMAL HIGH (ref 10–40)
AST: 41 U/L — ABNORMAL HIGH (ref 15–37)
Albumin/Globulin Ratio: 1.8 (ref 1.1–2.2)
Albumin: 4.6 gm/dl (ref 3.4–5.0)
Alkaline Phosphatase: 66 U/L (ref 45–129)
BUN: 14 mg/dl (ref 7–18)
CO2: 31 mEq/L (ref 21–32)
Calcium: 9.9 mg/dl (ref 8.3–10.6)
Chloride: 106 mEq/L (ref 99–110)
Creatinine: 1 mg/dl (ref 0.9–1.3)
GFR Est, African/Amer: >60
GFR, Estimated: >60 (ref >60)
Glucose: 93 mg/dl (ref 70–99)
Potassium: 4.7 mEq/L (ref 3.5–5.1)
Sodium: 143 mEq/L (ref 136–145)
Total Bilirubin: 0.5 mg/dl (ref 0.0–1.0)
Total Protein: 7.2 gm/dl (ref 6.4–8.2)

## 2010-12-04 LAB — CBC
Hematocrit: 47.1 % (ref 40.5–52.5)
Hemoglobin: 15.9 gm/dl (ref 13.5–17.5)
MCH: 29 pg (ref 26–34)
MCHC: 33.8 gm/dl (ref 31–36)
MCV: 85.8 fl (ref 80–100)
MPV: 9.1 fl (ref 5.0–10.5)
Platelets: 203 10*3 (ref 135–450)
RBC: 5.49 10*6 (ref 4.2–5.9)
RDW: 13.6 % (ref 11.5–14.5)
WBC: 6 10*3 (ref 4.0–11.0)

## 2010-12-04 LAB — LIPID PANEL
Cholesterol, Total: 218 mg/dl — ABNORMAL HIGH (ref <200)
HDL: 49 mg/dl (ref 40–60)
LDL Calculated: 137 mg/dl — ABNORMAL HIGH (ref <100)
Triglycerides: 162 mg/dl — ABNORMAL HIGH (ref <150)
VLDL Cholesterol Calculated: 32 mg/dl

## 2010-12-04 LAB — TSH: TSH: 2.84 u[IU]/mL (ref 0.35–5.5)

## 2010-12-04 NOTE — Progress Notes (Signed)
Subjective:      Patient ID: Bobby Hall is a 30 y.o. male.    HPI Comments: Patient presents with:    3 Month Follow-Up - Patient states that he is here for a 3 month follow up on some Bloating, Burping and Diarrhea. Patient states that since starting on Protonix patient states that all those sx's have subsided.           Review of Systems   Constitutional: Positive for unexpected weight change. Negative for fatigue.   HENT: Negative.  Negative for hearing loss, ear pain and sinus pressure.    Eyes: Negative.    Respiratory: Negative.  Negative for apnea, choking and stridor.    Cardiovascular: Negative.    Genitourinary: Positive for frequency. Negative for hematuria and penile swelling.   Musculoskeletal: Positive for back pain.   Skin: Negative.  Negative for color change and pallor.   Neurological: Negative.  Negative for dizziness, tremors, seizures and syncope.   Hematological: Does not bruise/bleed easily.   Psychiatric/Behavioral: Negative.  Negative for confusion, dysphoric mood and decreased concentration.       Objective:   Physical Exam   Constitutional: He appears well-developed and well-nourished.   HENT:   Mouth/Throat: No oropharyngeal exudate.   Eyes: Conjunctivae and EOM are normal. Pupils are equal, round, and reactive to light. No scleral icterus.   Neck: Normal range of motion. No thyromegaly present.   Cardiovascular: Normal rate, regular rhythm and intact distal pulses.    Pulmonary/Chest: Effort normal. He has no wheezes. He exhibits no tenderness.   Abdominal: Soft. He exhibits no mass. There is no tenderness.   Musculoskeletal: Normal range of motion. He exhibits no edema.   Lymphadenopathy:     He has no cervical adenopathy.   Neurological: He is alert. No cranial nerve deficit.   Skin: Skin is warm.   Psychiatric: His behavior is normal.       Assessment:      Encounter Diagnosis   Name Primary?   Marland Kitchen NASH (nonalcoholic steatohepatitis) Yes           Plan:      Referred to GI nd  weight control.

## 2011-01-22 NOTE — Patient Instructions (Signed)
Watch diet and lose weight.

## 2011-01-22 NOTE — Progress Notes (Signed)
Subjective:      Patient ID: Bobby Hall is a 30 y.o. male.    Gastrophageal Reflux  He complains of abdominal pain and belching. He reports no choking, no coughing, no heartburn or no hoarse voice. This is a chronic problem. The problem occurs frequently.       Review of Systems   Constitutional: Negative.    HENT: Negative for hearing loss, ear pain, hoarse voice and sinus pressure.    Eyes: Negative.    Respiratory: Negative.  Negative for apnea, cough, choking and stridor.    Gastrointestinal: Positive for abdominal pain. Negative for heartburn and diarrhea.   Genitourinary: Negative.  Negative for hematuria and penile swelling.   Musculoskeletal: Positive for back pain.   Skin: Negative.  Negative for color change and pallor.   Neurological: Negative for dizziness, tremors, seizures and syncope.   Hematological: Does not bruise/bleed easily.   Psychiatric/Behavioral: Positive for sleep disturbance and dysphoric mood. Negative for confusion and decreased concentration.       Objective:   Physical Exam   Constitutional: He appears well-developed and well-nourished.   HENT:   Mouth/Throat: No oropharyngeal exudate.   Eyes: Conjunctivae and EOM are normal. Pupils are equal, round, and reactive to light. No scleral icterus.   Neck: Normal range of motion. No thyromegaly present.   Cardiovascular: Normal rate, regular rhythm and intact distal pulses.    Pulmonary/Chest: Effort normal. He has no wheezes. He exhibits no tenderness.   Abdominal: Soft. He exhibits no mass. There is no tenderness.   Musculoskeletal: Normal range of motion. He exhibits no edema.   Lymphadenopathy:     He has no cervical adenopathy.   Neurological: He is alert. No cranial nerve deficit.   Skin: Skin is warm.   Psychiatric: His behavior is normal.       Assessment:      Encounter Diagnoses   Name Primary?   . GERD (gastroesophageal reflux disease) Yes   . Back pain            Plan:      Bobby Hall was seen today for gastrophageal  reflux.    Diagnoses and associated orders for this visit:    Gerd (gastroesophageal reflux disease)  - pantoprazole (PROTONIX) 40 MG tablet; Take 1 tablet by mouth daily (with breakfast).    Back pain

## 2011-01-23 LAB — FERRITIN: Ferritin: 184.7 ng/ml (ref 22–322)

## 2011-01-23 LAB — IRON AND TIBC
Iron Saturation: 19 % — ABNORMAL LOW (ref 20–50)
Iron: 57 ug/dl (ref 35–150)
TIBC: 305 ug/dl (ref 260–445)

## 2011-01-23 LAB — HEPATITIS B SURFACE ANTIGEN: Hepatitis B Surface Ag: NEGATIVE

## 2011-01-23 LAB — HEPATITIS C ANTIBODY: Hepatitis C Ab: NEGATIVE

## 2011-01-23 LAB — IGA: IgA, Serum: 196 mg/dl (ref 70–400)

## 2011-01-23 LAB — HEPATITIS B SURFACE ANTIBODY: Hep B S Ab: <2.8 m[IU]/mL — ABNORMAL LOW

## 2011-01-23 LAB — ANA

## 2011-01-24 LAB — CERULOPLASMIN: Ceruloplasmin: 22 mg/dl (ref 17–54)

## 2011-01-24 LAB — MITOCHONDRIAL ANTIBODIES, M2: Mitochondrial Ab: 7.3 units (ref 0.0–20.0)

## 2011-01-24 LAB — TISSUE TRANSGLUTAMINASE ANTIBODY IGA W/ REFLEX: Transglutaminase IgA: 4 AU (ref 0–19)

## 2011-01-24 LAB — F-ACTIN (SMOOTH MUSCLE) ANTIBODY W/ REFLEX TO TITER: F-Actin IgG: 5 units (ref 0–19)

## 2011-01-24 LAB — TISSUE TRANSGLUTAMINASE, IGG: Tissue Transglut Ab: 2 EU (ref 0–19)

## 2011-01-25 LAB — ALPHA-1-ANTITRYPSIN W/ PHENOTYPE: A-1 Antitrypsin: 115 mg/dl (ref 100–200)

## 2012-02-01 ENCOUNTER — Encounter

## 2012-02-08 LAB — POCT URINALYSIS DIPSTICK
Bilirubin, UA: NEGATIVE
Blood, UA POC: NEGATIVE
Glucose, UA POC: NEGATIVE
Ketones, UA: NEGATIVE
Leukocytes, UA: NEGATIVE
Nitrite, UA: NEGATIVE
Protein, UA POC: NEGATIVE
Spec Grav, UA: 1.025
Urobilinogen, UA: NEGATIVE
pH, UA: 6

## 2012-02-08 MED ORDER — PANTOPRAZOLE SODIUM 40 MG PO TBEC
40 MG | ORAL_TABLET | Freq: Every day | ORAL | Status: DC
Start: 2012-02-08 — End: 2012-04-07

## 2012-02-08 NOTE — Progress Notes (Signed)
Subjective:      Patient ID: Bobby Hall is a 32 y.o. male.    HPI Comments: Patient presents with:    Medication Refill - Patient states that Bobby Hall is here to get a refill on his Protonix.           Review of Systems   Constitutional: Negative.  Negative for appetite change.   HENT: Negative for hearing loss, ear pain and sinus pressure.    Eyes: Negative.  Negative for visual disturbance.   Respiratory: Negative.  Negative for apnea, choking and stridor.    Cardiovascular: Negative.    Gastrointestinal: Positive for diarrhea.        Gas and loose bowel if not use protonix.   Genitourinary: Positive for frequency. Negative for hematuria, discharge, penile swelling and penile pain.   Musculoskeletal: Positive for back pain.   Skin: Negative.  Negative for color change and pallor.   Neurological: Positive for numbness (herniated disc.). Negative for dizziness, tremors, seizures and syncope.   Hematological: Does not bruise/bleed easily.   Psychiatric/Behavioral: Negative.  Negative for confusion, dysphoric mood and decreased concentration.       Objective:   Physical Exam   Constitutional: Bobby Hall appears well-developed and well-nourished.   HENT:   Mouth/Throat: No oropharyngeal exudate.   Eyes: Conjunctivae and EOM are normal. Pupils are equal, round, and reactive to light. No scleral icterus.   Neck: Normal range of motion. No thyromegaly present.   Cardiovascular: Normal rate, regular rhythm and intact distal pulses.    Pulmonary/Chest: Effort normal. Bobby Hall has no wheezes. Bobby Hall exhibits no tenderness.   Abdominal: Soft. Bobby Hall exhibits no mass. There is no tenderness.   Musculoskeletal: Normal range of motion. Bobby Hall exhibits no edema.   Lymphadenopathy:     Bobby Hall has no cervical adenopathy.   Neurological: Bobby Hall is alert. No cranial nerve deficit.   Skin: Skin is warm.   Psychiatric: His behavior is normal.       Assessment:      Encounter Diagnoses   Name Primary?   ??? GERD (gastroesophageal reflux disease) Yes   ??? Back pain    ???  Urinary urgency            Plan:      Bobby Hall was seen today for medication refill.    Diagnoses and associated orders for this visit:    GERD (gastroesophageal reflux disease)  - pantoprazole (PROTONIX) 40 MG tablet; Take 1 tablet by mouth daily.    Back pain  - POCT Urinalysis no Micro    Other Orders  - gabapentin (NEURONTIN) 300 MG capsule; Take 300 mg by mouth 3 times daily.  - HYDROcodone-acetaminophen (LORTAB) 10-500 MG per tablet; Take 1 tablet by mouth every 6 hours as needed for Pain.

## 2012-04-07 NOTE — Progress Notes (Signed)
Subjective:      Patient ID: Bobby Hall is a 32 y.o. male.    HPI Comments: Patient presents with:    Finger Pain - Patient states that he started about 3 weeks ago with pain in his Left Index Finger that has ben increasing. Patient is only able to bend the finger about 1/2 way before it becomes painful.     Medial side of index finger is burning and hurt when ties shoe lace. No pain at joint. Lt elbow deformed.      Review of Systems   Constitutional: Negative.    HENT: Negative for hearing loss, ear pain and sinus pressure.    Eyes: Negative.    Respiratory: Negative for apnea, choking and stridor.    Genitourinary: Negative for hematuria, discharge, penile swelling and penile pain.   Musculoskeletal: Positive for back pain.   Skin: Negative for color change and pallor.   Neurological: Positive for numbness. Negative for dizziness, tremors, seizures and syncope.   Hematological: Does not bruise/bleed easily.   Psychiatric/Behavioral: Negative for confusion, dysphoric mood and decreased concentration.       Objective:   Physical Exam   Constitutional: He appears well-developed and well-nourished.   HENT:   Mouth/Throat: No oropharyngeal exudate.   Eyes: Conjunctivae and EOM are normal. Pupils are equal, round, and reactive to light. No scleral icterus.   Neck: Normal range of motion. No thyromegaly present.   Cardiovascular: Normal rate, regular rhythm and intact distal pulses.    Pulmonary/Chest: Effort normal. He has no wheezes. He exhibits no tenderness.   Abdominal: Soft. He exhibits no mass. There is no tenderness.   Musculoskeletal: Normal range of motion. He exhibits no edema.        Left elbow: He exhibits deformity.        Arms:  Lymphadenopathy:     He has no cervical adenopathy.   Neurological: He is alert. No cranial nerve deficit.   Skin: Skin is warm.   Psychiatric: His behavior is normal.       Assessment:      Encounter Diagnosis   Name Primary?   ??? Neuritis Yes            Plan:      Bobby Hall was  seen today for finger pain.    Diagnoses and associated orders for this visit:    Neuritis    Other Orders  - HYDROcodone-acetaminophen (NORCO) 10-325 MG per tablet; Take 1 tablet by mouth 4 times daily.        Possible from deformed elbow pintching nerve ending. Not from arthritis. Follow up in 2-4 weeks with EMG.

## 2012-07-27 NOTE — Telephone Encounter (Signed)
Please call Charlene [phone] ext [phone] regarding blood type on [place].   She is wanting to get Keontre's Blood Type.

## 2012-07-28 NOTE — Telephone Encounter (Signed)
Spoke with Bobby Hall, she is pregnant and was needing to know if we had a record of his Blood Type or if we could order the testing for him. I explained that we don't order that type of testing and that she may check with the Hospital where he has had surgeries and they would have a record of that on file.    Patient's wife may need to takes shots depending on his blood type. Her ob-gyn could order the testing but it may not be covered by his insurance.

## 2012-08-18 ENCOUNTER — Encounter

## 2012-08-23 NOTE — Progress Notes (Signed)
Subjective:      Patient ID: Bobby Hall is a 32 y.o. male.    HPI Comments: Patient presents with:  Pain: Patient states that he started back in mid Feb with off/on pain and numbness in his Left Hand, Fingers and Arm that seems to be getting worse. Patient states that the pain and numbness started back about 3 weeks ago. Not able to get Arm comfortable. Taking Norco and Gabapentin for a back Injury and that helps very little.  Otalgia: Patient states that he started with a Headache last night and woke up with pain in both ears with the Right Ear is worse.           Review of Systems   Constitutional: Negative for fatigue.   HENT: Positive for ear pain. Negative for hearing loss and congestion.    Respiratory: Negative.    Cardiovascular: Negative.    Gastrointestinal: Negative.    Musculoskeletal: Positive for back pain.   Neurological: Positive for numbness (lt hand 2nd and 3rd finger are numb and painful. wake him up in PM.).   Psychiatric/Behavioral: Positive for sleep disturbance.       Objective:   Physical Exam   Constitutional: He appears well-developed and well-nourished.   HENT:   Mouth/Throat: No oropharyngeal exudate.   Ear canal and membrane is clear.   Eyes: Conjunctivae and EOM are normal. Pupils are equal, round, and reactive to light. No scleral icterus.   Neck: Normal range of motion. No thyromegaly present.   Cardiovascular: Normal rate, regular rhythm and intact distal pulses.    Pulmonary/Chest: Effort normal. He has no wheezes. He exhibits no tenderness.   Abdominal: Soft. He exhibits no mass. There is no tenderness.   Musculoskeletal: Normal range of motion. He exhibits no edema.   Lymphadenopathy:     He has no cervical adenopathy.   Neurological: He is alert. No cranial nerve deficit.   + Tinel sign of lt wrist.   Skin: Skin is warm.   Psychiatric: His behavior is normal.       Assessment:      Encounter Diagnoses   Name Primary?   ??? CTS (carpal tunnel syndrome), left Yes   ??? Ear pain,  right             Plan:      Bobby Hall was seen today for pain and otalgia.    Diagnoses and associated orders for this visit:    CTS (carpal tunnel syndrome), left    Ear pain, right        On pain med and neurontin for neuritis of back. Ear pain can be controlled with pain med for back.

## 2012-08-29 NOTE — Procedures (Signed)
[facility] [address]  [place], [place] [address]  [phone]     Electrodiagnostic Medicine  Rehabilitation Services Department      Exam Date: 08/29/2012  Name: Truett, Mcfarlan. Exam Time: 3:24 PM  MR#: [id number] Patient ID: [id number]  Gender:  Date of Birth: October 02, 1980    Age: 32    Referring Physician: Anselm Pancoast M.D.       Clinical Problem:   Patient is a 31 yo WM with prior lumbar surgery over 10 yrs. ago who complains of left hand paresthesias with associated decreased grip strength.  Symptoms have increased past month.  PE reveals fair to good strength.  Reflexes are present and symmetric.      Summary:   1. NCS reveal moderate prolongation and diminution of left median sensory and motor distal evoked responses.    2. Needle EMG reveals polyphasic voluntary motor unit potentials with slight decreased recruitment within left C6 innervated muscles.  No muscle membrane irritability (sustained muscle denervation) is noted however.      Impression:   1. Moderate left carpal tunnel syndrome.  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  2. Probable superimposed mild left C6 radiculopathy.  Subacute reinnervation appears to be present in left C6 innervated muscles.   3. No evidence of diffuse peripheral neuropathy or other focal mononeuropathy involving the left upper extremity.      (Note:  If this report is being viewed under CHART REVIEW/PROCEDURES, table format can be better viewed using CHART REVIEW/NOTES/Note type: procedure)    Thank you.             Wesley Ducre B. Hal Hope, M.D.        Motor Nerve Conduction:            Nerve and Site   Lat  ms Amp  mV Segment   Dist  mm Lat Diff  ms CV   m/s   Median.L to Abductor pollicis brevis.L      Wrist 5.0 2.5 Abductor pollicis brevis-Wrist 80 5.0    Elbow 9.4 2.4 Wrist-Elbow 220 4.4 50   Ulnar.L to Abductor digiti minimi (manus).L      Wrist 2.8 8.6 Abductor digiti minimi (manus)-Wrist 80 2.8    Above elbow 7.9 8.4 Wrist-Above elbow 260 5.1  51     Nerve Lat ? ms Amp ? mV CV ? m/s  Median 4.2 4 50  Ulnar 4.2 4 50  Peroneal 5.5 2 40  Tibial 6.1 4 40       Sensory and Mixed Nerve Conduction:             Nerve and Site   Peak  Lat ms Amp  mV Segment   Lat Diff  ms Dist  mm   Median.L to Digit II (index finger).L      Wrist 4.9 10 Digit II (index finger)-Wrist  140   Ulnar.L to Digit V (little finger).L      Wrist 2.9 20 Digit V (little finger)-Wrist  140   Radial.L to Anatomical snuff box.L      Forearm 2.5 22 Anatomical snuff box-Forearm  100     Nerve Peak Lat ? ms Amp ? ??V   Median 3.6 20  Ulnar 3.7 20  Radial 2.8 10  Sural 4.0 5  Peroneal 3.5 5    Needle EMG Examination:      Muscle Insertion Spontaneous Activity Volutional MUAPs Comments    Activity Fibs PSW Fasc Other Effort  Recruit Dur Amp Poly     Deltoid.L Normal 0 0 None  Normal Normal Normal Normal None    Biceps brachii.L Normal 0 0 None  Normal Normal Normal Normal None    Triceps brachii.L Normal 0 0 None  Normal Normal Normal Normal None    Brachioradialis.L Normal 0 0 None  Normal Reduced Sl Incr Normal Few    Pronator teres.L Normal 0 0 None  Normal Reduced Sl Incr Normal Few    Extensor indicis proprius.L Normal 0 0 None  Normal Normal Normal Normal None    Abductor pollicis brevis.L Normal 0 0 None  Normal Normal Normal Normal None    1st dorsal interosseous.L Normal 0 0 None  Normal Normal Normal Normal None

## 2012-09-16 NOTE — Other (Unsigned)
[facility]      [address] [place] [id number]         EMERGENCY DEPARTMENT REPORT     NAME:                   Bobby Hall, Bobby Hall       ACCT NUMBER:                   [id number]   GENDER:                 M                       PATIENT TYPE:                  EMERG   AGE:                    32                      ROOM NUMBER:                   ED   DATE OF BIRTH:          1980/02/06              MRN:                           [id number]       DATE OF SERVICE:        09/16/2012              FAMILY PHYS:                   TAI Aquilla HackerWON KIM, M.D.     CHIEF COMPLAINT: Back pain and numbness right leg.   HISTORY OF PRESENT ILLNESS: The patient is a 32 year old male with a long   history of back   problems, radiculopathies, and surgeries. His symptoms always been in his   left leg. Over the last several   days, he has developed numbness from his right knee down to his foot and the   pain has been markedly   severe to the point where he has had to buy a walker to help him ambulate.   __________ spoken with his   pain management physician who is unable to see him and referred him to the   emergency department.   He has been taking his normal medications of Norco and gabapentin. There has   been no recent injury or   unusual activity that might have set this off. Nursing notes reviewed.   PAST MEDICAL AND SURGICAL HISTORY: Past medical history is otherwise   unremarkable.   MEDICATIONS: Include those listed above plus __________.   ALLERGIES: He is allergic to Avelox.   SOCIAL HISTORY: There is no use of tobacco, alcohol, or illicit drugs.   REVIEW OF SYSTEMS: There has been no fever, chills. Skin, no rash or easy   bruising. ENT, no   headache, sore throat, runny nose. Pulmonary, no coughing or wheezing. GI, no   vomiting. No loss of   bowel control. GU, no loss of bladder control. All other review of systems   are reviewed and negative.   PHYSICAL EXAMINATION:   VITAL SIGNS: Temperature 98, pulse 117,  respirations 20, pressure 157/103,   saturations 94%.  GENERAL: Male who seems quite uncomfortable. Prefers to stand, leaning on his   walker.   MUSCULOSKELETAL: Examination of the back shows mild tenderness over the L4,   L5 area. He is able   to walk with difficulty. There is no footdrop. I have a hard time examining   his strength or even his   reflexes. He has more severe pain when he lies down. Deep tendon reflexes   seen symmetrically   decreased.   TREATMENT: In the ER: I did give him Dilaudid 2 mg IM, Norflex __________ mg   IM, and Decadron 10   mg p.o. I did review his case on the [facility] website. Eventually I was able to   speak to the nurse   practitioner of his pain management physician. She went over his most recent   MRI report which was   done a couple months ago. It showed no significant nerve impingement on the   right side. At this point,   we agreed I could give this patient stronger analgesia as well as treat him   with some oral steroids. He   was sent home with a prescription for Medrol Dosepak and for Percocet #20. He   has an appointment to   see the pain management physician Tuesday, September 20, 2012, in 4 days. He is   to not take his Norco   while he is on the Percocet. The patient is much more comfortable when he   leaves and able to ambulate   much easier. He does have a driver.   CLINICAL IMPRESSION: At this point, the patient has worsening of his chronic   pain including a possible   new radicular component to it.   FINAL DIAGNOSIS: Exacerbation of chronic lumbar pain with new radicular   component.   PLAN: Disposition is home.   CONDITION: Good.   CONSULTATIONS: Were to the office of the pain management physician.   CRITICAL CARE: None.                                 DCTNAME                               RADCRED                               SIGNDATE   Waymon Amato, M.D.       SR[facility]/sts                            [facility]   [address] [place]  [address]     EMERGENCY DEPARTMENT REPORT     NAME:               Bobby Hall NUMBER:             [id number]   GENDER:             M                    PATIENT TYPE:            EMERG   AGE:  32                   ROOM NUMBER:             ED   DATE OF BIRTH:      Nov 10, 1980           MRN:                     [id number]     SOAP Job #: [id number]   SOAP Document #: [id number]   Dict: 09/16/2012 17:23   Tran: 09/16/2012 18:40

## 2012-11-28 NOTE — Progress Notes (Signed)
Subjective:      Patient ID: Bobby Hall is a 32 y.o. male.    HPI Comments: Bobby Hall is a 32 y.o. male with the following history as recorded in EpicCare:  Patient Active Problem List    GERD (gastroesophageal reflux disease)      Back Pain         Date Noted: 09/20/2008      Current Outpatient Prescriptions:  oxyCODONE-acetaminophen (PERCOCET) 10-325 MG per tablet, Take 1 tablet by mouth every 4 hours as needed for Pain., Disp: , Rfl:   gabapentin (NEURONTIN) 400 MG capsule, Take 400 mg by mouth 4 times daily., Disp: , Rfl:   pantoprazole (PROTONIX) 40 MG tablet, Take 1 tablet by mouth daily (with breakfast)., Disp: 90 tablet, Rfl: 3    No current facility-administered medications for this visit.    Allergies: Avelox  Past Medical History:    Pneumonia                                       14 years *    GERD (gastroesophageal reflux disease)                        Chronic back pain                                               Comment:Workers Comp    Back pain                                       09/20/2008       Comment:Workers Comp    Herniated disc                                                  Comment:Workers Comp  Past Surgical History:    BACK SURGERY                                                     Comment:Workers Comp    FINGER SURGERY                                                   Comment:rt ring finger  Review of patient's family history indicates:    High Blood Pressure            Mother                    Diabetes                       Mother  Comment: borderline    Diabetes                       Father                    Sudden Death                   Sister                    Substance Abuse                Sister                      Smoking Status: Never Smoker                      Smokeless Status: Never Used                        Alcohol Use: No              Patient presents with:  Pre-op Exam: Patient here for a Pre Op Exam and to get Clearence for  Surgery. Patient having Lumbar Laminectomt and Microdiscectomy @ L3-4 under General Anesthesia by Dr Gordy Savers @ [facility] on 12/05/12. Fax # [phone], [phone].           Review of Systems   Constitutional: Positive for fatigue. Negative for fever.   HENT: Positive for ear pain. Negative for hearing loss and sinus pressure.    Eyes: Negative.  Negative for visual disturbance.   Respiratory: Negative.  Negative for apnea, choking and stridor.    Cardiovascular: Negative.    Gastrointestinal: Negative.  Negative for constipation.   Genitourinary: Negative.  Negative for hematuria, penile swelling and penile pain.   Musculoskeletal: Positive for back pain.        Rt leg pain from the back and numb.   Skin: Negative for color change and pallor.   Neurological: Positive for weakness (rt leg is weak loss of balance.). Negative for dizziness, tremors, seizures and syncope.   Hematological: Does not bruise/bleed easily.   Psychiatric/Behavioral: Positive for dysphoric mood. Negative for confusion and decreased concentration. The patient is nervous/anxious.        Objective:   Physical Exam   Constitutional: He appears well-developed and well-nourished.   HENT:   Mouth/Throat: No oropharyngeal exudate.   Eyes: Conjunctivae and EOM are normal. Pupils are equal, round, and reactive to light. No scleral icterus.   Neck: Normal range of motion. No thyromegaly present.   Cardiovascular: Normal rate, regular rhythm and intact distal pulses.    Pulmonary/Chest: Effort normal. He has no wheezes. He exhibits no tenderness.   Abdominal: Soft. He exhibits no mass. There is no tenderness.   Musculoskeletal: Normal range of motion. He exhibits tenderness. He exhibits no edema.   Lymphadenopathy:     He has no cervical adenopathy.   Neurological: He is alert. He displays abnormal reflex. No cranial nerve deficit.   Rt leg is painful and sore. Weak and slow reflex.   Skin: Skin is warm.   Psychiatric: His  behavior is normal.       Assessment:      Encounter Diagnoses   Name Primary?   ??? Pre-op examination Yes   ??? Lumbar herniated disc  Plan:      Bobby Hall was seen today for pre-op exam.    Diagnoses and associated orders for this visit:    Pre-op examination    Lumbar herniated disc    Other Orders  - PROTIME-INR  - APTT  - CBC  - Basic Metabolic Panel        Medically clear to have back surgery.

## 2012-11-29 LAB — BASIC METABOLIC PANEL
BUN: 12 mg/dL (ref 7–20)
CO2: 26 mmol/L (ref 21–32)
Calcium: 9.8 mg/dL (ref 8.3–10.6)
Chloride: 103 mmol/L (ref 99–110)
Creatinine: 1 mg/dL (ref 0.9–1.3)
GFR African American: >60 (ref >60)
GFR Non-African American: >60 (ref >60)
Glucose: 115 mg/dL — ABNORMAL HIGH (ref 70–99)
Potassium: 4.1 mmol/L (ref 3.5–5.1)
Sodium: 143 mmol/L (ref 136–145)

## 2012-11-29 LAB — CBC WITH AUTO DIFFERENTIAL
Basophils %: 0.4 %
Basophils Absolute: 0 10*3/uL (ref 0.0–0.2)
Eosinophils %: 1.5 %
Eosinophils Absolute: 0.1 10*3/uL (ref 0.0–0.6)
Hematocrit: 43.7 % (ref 40.5–52.5)
Hemoglobin: 14.8 g/dL (ref 13.5–17.5)
Lymphocytes %: 26.9 %
Lymphocytes Absolute: 2.2 10*3/uL (ref 1.0–5.1)
MCH: 29.2 pg (ref 26.0–34.0)
MCHC: 33.9 g/dL (ref 31.0–36.0)
MCV: 86.1 fL (ref 80.0–100.0)
MPV: 9 fL (ref 5.0–10.5)
Monocytes %: 7.1 %
Monocytes Absolute: 0.6 10*3/uL (ref 0.0–1.3)
Neutrophils %: 64.1 %
Neutrophils Absolute: 5.3 10*3/uL (ref 1.7–7.7)
Platelets: 205 10*3/uL (ref 135–450)
RBC: 5.07 M/uL (ref 4.20–5.90)
RDW: 13.7 % (ref 12.4–15.4)
WBC: 8.2 10*3/uL (ref 4.0–11.0)

## 2012-11-29 LAB — PROTIME-INR
INR: 0.89 (ref 0.85–1.16)
Protime: 9.6 s (ref 9.1–12.6)

## 2012-11-29 LAB — APTT: aPTT: 32.2 s (ref 23.1–36.1)

## 2013-02-13 ENCOUNTER — Encounter

## 2013-02-13 MED ORDER — PANTOPRAZOLE SODIUM 40 MG PO TBEC
40 MG | ORAL_TABLET | Freq: Every day | ORAL | Status: DC
Start: 2013-02-13 — End: 2013-02-28

## 2013-02-13 NOTE — Telephone Encounter (Signed)
Informed the patient rx was sent to the pharmacy

## 2013-02-13 NOTE — Telephone Encounter (Signed)
Pt has an appt on 1/20 and pt wants to know if Dr Selena BattenKim would refill his pantoprazole (PROTONIX) 40 MG tablet since he has an appt. [facility] to [facility] Pl advise pt.

## 2013-02-27 LAB — COMPREHENSIVE METABOLIC PANEL
ALT: 71 U/L — ABNORMAL HIGH (ref 10–40)
AST: 36 U/L (ref 15–37)
Albumin/Globulin Ratio: 1.7 (ref 1.1–2.2)
Albumin: 4.5 g/dL (ref 3.4–5.0)
Alkaline Phosphatase: 77 U/L (ref 40–129)
Anion Gap: 13 (ref 3–16)
BUN: 12 mg/dL (ref 7–20)
CO2: 28 mmol/L (ref 21–32)
Calcium: 9.8 mg/dL (ref 8.3–10.6)
Chloride: 103 mmol/L (ref 99–110)
Creatinine: 0.8 mg/dL — ABNORMAL LOW (ref 0.9–1.3)
GFR African American: >60 (ref >60)
GFR Non-African American: >60 (ref >60)
Globulin: 2.7 g/dL
Glucose: 124 mg/dL — ABNORMAL HIGH (ref 70–99)
Potassium: 4.5 mmol/L (ref 3.5–5.1)
Sodium: 144 mmol/L (ref 136–145)
Total Bilirubin: 0.6 mg/dL (ref 0.0–1.0)
Total Protein: 7.2 g/dL (ref 6.4–8.2)

## 2013-02-27 LAB — LIPID PANEL
Cholesterol, Total: 186 mg/dL (ref 0–199)
HDL: 42 mg/dL (ref 40–60)
LDL Calculated: 114 mg/dL — ABNORMAL HIGH (ref <100)
Triglycerides: 152 mg/dL — ABNORMAL HIGH (ref 0–150)
VLDL Cholesterol Calculated: 30 mg/dL

## 2013-02-27 NOTE — Progress Notes (Signed)
Patient here for fasting annual blood work

## 2013-02-27 NOTE — Progress Notes (Signed)
Has appointment tomorrow.

## 2013-02-28 LAB — HEMOGLOBIN A1C
Hemoglobin A1C: 6.2 %
eAG: 131.2 mg/dL

## 2013-02-28 MED ORDER — PANTOPRAZOLE SODIUM 40 MG PO TBEC
40 MG | ORAL_TABLET | Freq: Every day | ORAL | Status: DC
Start: 2013-02-28 — End: 2015-03-04

## 2013-02-28 NOTE — Progress Notes (Signed)
Reviewed it with him today.

## 2013-02-28 NOTE — Progress Notes (Signed)
Subjective:      Patient ID: Bobby Hall is a 33 y.o. male.    HPI Comments: Patient presents with:  Annual Exam: Patient here for an Annual Exam, Medication Refill and to discuss his Lab Results.           Review of Systems   Constitutional: Positive for fatigue.   HENT: Positive for ear pain. Negative for hearing loss and sinus pressure.    Eyes: Negative.    Respiratory: Negative.  Negative for apnea, choking and stridor.    Gastrointestinal: Positive for abdominal pain.        On Protonix   Genitourinary: Negative.  Negative for hematuria, penile swelling and penile pain.   Musculoskeletal: Positive for back pain.   Skin: Negative.  Negative for color change and pallor.   Neurological: Positive for dizziness (related position of head.). Negative for tremors, seizures and syncope.   Hematological: Does not bruise/bleed easily.   Psychiatric/Behavioral: Negative for confusion, dysphoric mood and decreased concentration.       Objective:   Physical Exam   Constitutional: He appears well-developed and well-nourished.   HENT:   Mouth/Throat: No oropharyngeal exudate.   Eyes: Conjunctivae and EOM are normal. Pupils are equal, round, and reactive to light. No scleral icterus.   Neck: Normal range of motion. No thyromegaly present.   Cardiovascular: Normal rate, regular rhythm and intact distal pulses.    Pulmonary/Chest: Effort normal. He has no wheezes. He exhibits no tenderness.   Abdominal: Soft. He exhibits no mass. There is no tenderness.   Musculoskeletal: Normal range of motion. He exhibits no edema.   Lymphadenopathy:     He has no cervical adenopathy.   Neurological: He is alert. No cranial nerve deficit.   Skin: Skin is warm.   Psychiatric: His behavior is normal.       Assessment:      Encounter Diagnoses   Name Primary?   ??? Routine general medical examination at a health care facility Yes   ??? GERD (gastroesophageal reflux disease)    ??? DM (diabetes mellitus) (HCC)    ??? Back pain    ??? Fatty liver              Plan:      Bobby HuaDavid was seen today for annual exam.    Diagnoses and associated orders for this visit:    Routine general medical examination at a health care facility    GERD (gastroesophageal reflux disease)  - pantoprazole (PROTONIX) 40 MG tablet; Take 1 tablet by mouth daily (with breakfast).    DM (diabetes mellitus) (HCC)    Back pain    Fatty liver

## 2013-10-16 MED ORDER — DOXYCYCLINE HYCLATE 100 MG PO TABS
100 MG | ORAL_TABLET | Freq: Two times a day (BID) | ORAL | Status: AC
Start: 2013-10-16 — End: 2013-10-23

## 2013-10-16 NOTE — Progress Notes (Signed)
Subjective:      Patient ID: Bobby Hall is a 33 y.o. male.    HPI Comments: Patient presents with:  Pharyngitis: fever, sore throat, ear pain , swollen glands x 3 days.        Pharyngitis  This is a new problem. The current episode started in the past 7 days. The problem occurs constantly. Associated symptoms include anorexia, arthralgias, chills, coughing, a fever, headaches and myalgias. Pertinent negatives include no vomiting. Nothing aggravates the symptoms. He has tried acetaminophen and NSAIDs for the symptoms. The treatment provided mild relief.       Review of Systems   Constitutional: Positive for fever and chills.   HENT: Positive for postnasal drip and rhinorrhea. Negative for tinnitus.    Respiratory: Positive for cough.         Brown thick sputums.   Cardiovascular: Negative.    Gastrointestinal: Positive for anorexia. Negative for vomiting and diarrhea.   Genitourinary: Negative.    Musculoskeletal: Positive for myalgias and arthralgias.   Skin: Negative.    Neurological: Positive for headaches.       Objective:   Physical Exam   Constitutional: He appears well-developed and well-nourished.   HENT:   Mouth/Throat: No oropharyngeal exudate.   Small superficial ulcer lt side uvula of throat.   Eyes: Conjunctivae and EOM are normal. Pupils are equal, round, and reactive to light. No scleral icterus.   Neck: Normal range of motion. No thyromegaly present.   Cardiovascular: Normal rate, regular rhythm and intact distal pulses.    Pulmonary/Chest: Effort normal. He has no wheezes. He exhibits no tenderness.   Abdominal: Soft. He exhibits no mass. There is no tenderness.   Musculoskeletal: Normal range of motion. He exhibits no edema.   Lymphadenopathy:     He has no cervical adenopathy.   Neurological: He is alert. No cranial nerve deficit.   Skin: Skin is warm.   Psychiatric: His behavior is normal.       Assessment:      Encounter Diagnoses   Name Primary?   ??? URI (upper respiratory infection) Yes    ??? Aphthous ulcer             Plan:      Bobby Hall was seen today for pharyngitis.    Diagnoses and associated orders for this visit:    URI (upper respiratory infection)    Aphthous ulcer

## 2013-10-16 NOTE — Patient Instructions (Signed)
Please call your pharmacy if you need any refills of your medication(s).    Please call our office at [phone] if you don't hear from us about your test results.                                                                 Bring an accurate list of your medications with you at every appointment to ensure that we have the correct information.    Our office hours are: Monday - Friday 8:30 am- 5 pm    Phone lines turn on at 8:30 am

## 2014-06-07 ENCOUNTER — Telehealth

## 2014-06-07 ENCOUNTER — Encounter: Admit: 2014-06-07 | Discharge: 2014-06-07 | Payer: BLUE CROSS/BLUE SHIELD | Primary: Internal Medicine

## 2014-06-07 DIAGNOSIS — Z0000 Encounter for general adult medical examination without abnormal findings: Secondary | ICD-10-CM

## 2014-06-07 LAB — TSH: TSH: 3.76 u[IU]/mL (ref 0.27–4.20)

## 2014-06-07 LAB — CBC
Hematocrit: 46.7 % (ref 40.5–52.5)
Hemoglobin: 16 g/dL (ref 13.5–17.5)
MCH: 29 pg (ref 26.0–34.0)
MCHC: 34.2 g/dL (ref 31.0–36.0)
MCV: 84.7 fL (ref 80.0–100.0)
MPV: 8.6 fL (ref 5.0–10.5)
Platelets: 189 10*3/uL (ref 135–450)
RBC: 5.51 M/uL (ref 4.20–5.90)
RDW: 13.8 % (ref 12.4–15.4)
WBC: 7.1 10*3/uL (ref 4.0–11.0)

## 2014-06-07 LAB — COMPREHENSIVE METABOLIC PANEL
ALT: 58 U/L — ABNORMAL HIGH (ref 10–40)
AST: 41 U/L — ABNORMAL HIGH (ref 15–37)
Albumin/Globulin Ratio: 1.7 (ref 1.1–2.2)
Albumin: 4.7 g/dL (ref 3.4–5.0)
Alkaline Phosphatase: 79 U/L (ref 40–129)
Anion Gap: 16 (ref 3–16)
BUN: 14 mg/dL (ref 7–20)
CO2: 24 mmol/L (ref 21–32)
Calcium: 9.6 mg/dL (ref 8.3–10.6)
Chloride: 101 mmol/L (ref 99–110)
Creatinine: 0.8 mg/dL — ABNORMAL LOW (ref 0.9–1.3)
GFR African American: >60 (ref >60)
GFR Non-African American: >60 (ref >60)
Globulin: 2.7 g/dL
Glucose: 116 mg/dL — ABNORMAL HIGH (ref 70–99)
Potassium: 4.5 mmol/L (ref 3.5–5.1)
Sodium: 141 mmol/L (ref 136–145)
Total Bilirubin: 0.3 mg/dL (ref 0.0–1.0)
Total Protein: 7.4 g/dL (ref 6.4–8.2)

## 2014-06-07 LAB — LIPID PANEL
Cholesterol, Total: 230 mg/dL — ABNORMAL HIGH (ref 0–199)
HDL: 37 mg/dL — ABNORMAL LOW (ref 40–60)
Triglycerides: 416 mg/dL — ABNORMAL HIGH (ref 0–150)

## 2014-06-07 LAB — LDL CHOLESTEROL, DIRECT: LDL Direct: 134 mg/dL — ABNORMAL HIGH (ref <100)

## 2014-06-07 NOTE — Telephone Encounter (Signed)
Lab orders signed

## 2014-06-07 NOTE — Progress Notes (Signed)
Patient here for fasting blood work

## 2014-06-08 ENCOUNTER — Inpatient Hospital Stay: Attending: Physical Medicine & Rehabilitation | Primary: Internal Medicine

## 2014-06-08 ENCOUNTER — Ambulatory Visit
Admit: 2014-06-08 | Discharge: 2014-06-08 | Payer: BLUE CROSS/BLUE SHIELD | Attending: Internal Medicine | Primary: Internal Medicine

## 2014-06-08 ENCOUNTER — Ambulatory Visit: Admit: 2014-06-08 | Payer: BLUE CROSS/BLUE SHIELD | Primary: Internal Medicine

## 2014-06-08 ENCOUNTER — Encounter

## 2014-06-08 DIAGNOSIS — M5126 Other intervertebral disc displacement, lumbar region: Secondary | ICD-10-CM

## 2014-06-08 DIAGNOSIS — Z0000 Encounter for general adult medical examination without abnormal findings: Secondary | ICD-10-CM

## 2014-06-08 LAB — HEMOGLOBIN A1C
Hemoglobin A1C: 6.4 %
eAG: 137 mg/dL

## 2014-06-08 MED ORDER — CLARITHROMYCIN 500 MG PO TABS
500 MG | ORAL_TABLET | Freq: Two times a day (BID) | ORAL | Status: AC
Start: 2014-06-08 — End: 2014-06-22

## 2014-06-08 MED ORDER — ATORVASTATIN CALCIUM 20 MG PO TABS
20 MG | ORAL_TABLET | Freq: Every day | ORAL | Status: DC
Start: 2014-06-08 — End: 2014-08-10

## 2014-06-08 MED ORDER — AMOXICILLIN 500 MG PO TABS
500 MG | ORAL_TABLET | Freq: Two times a day (BID) | ORAL | Status: AC
Start: 2014-06-08 — End: 2014-06-22

## 2014-06-08 MED ORDER — PANTOPRAZOLE SODIUM 40 MG PO TBEC
40 MG | ORAL_TABLET | Freq: Every day | ORAL | Status: DC
Start: 2014-06-08 — End: 2014-08-10

## 2014-06-08 MED ORDER — CLARITHROMYCIN ER 500 MG PO TB24
500 MG | ORAL_TABLET | Freq: Every day | ORAL | Status: DC
Start: 2014-06-08 — End: 2014-06-08

## 2014-06-08 MED ORDER — PANTOPRAZOLE SODIUM 40 MG PO TBEC
40 MG | ORAL_TABLET | Freq: Every day | ORAL | Status: DC
Start: 2014-06-08 — End: 2014-06-08

## 2014-06-08 MED ORDER — AMOXICILLIN 500 MG PO TABS
500 MG | ORAL_TABLET | Freq: Two times a day (BID) | ORAL | Status: DC
Start: 2014-06-08 — End: 2014-06-08

## 2014-06-08 MED ORDER — CLARITHROMYCIN ER 500 MG PO TB24
500 MG | ORAL_TABLET | Freq: Two times a day (BID) | ORAL | Status: DC
Start: 2014-06-08 — End: 2014-06-08

## 2014-06-08 MED ORDER — AZITHROMYCIN 250 MG PO TABS
250 MG | PACK | ORAL | Status: DC
Start: 2014-06-08 — End: 2014-06-08

## 2014-06-08 NOTE — Addendum Note (Signed)
Addended byBrennan Bailey on: 06/08/2014 12:22 PM     Modules accepted: Orders, Medications

## 2014-06-08 NOTE — Telephone Encounter (Signed)
[facility] called with questions about the quantity on the clarithromycin (BIAXIN XL) 500 MG XL tablet   And the Biaxin and the Zithromax are similar they wanted to make sure he wanted both. It looks like he canceled the Zithromax. Please call about the quantity of the Biaxin.

## 2014-06-08 NOTE — Progress Notes (Signed)
Subjective:      Patient ID: Bobby BasqueDavid V Smith Hall is a 34 y.o. male.    HPI Comments: Patient presents with:  Annual Exam: Patient here for an Annual Exam, Medication Refills and to discuss Lab Results.           Review of Systems   Constitutional: Positive for fatigue.   HENT: Negative.  Negative for ear pain, hearing loss and sinus pressure.    Eyes: Negative.    Respiratory: Negative.  Negative for apnea, choking and stridor.    Cardiovascular: Negative.    Gastrointestinal: Positive for abdominal pain and diarrhea.   Genitourinary: Negative.  Negative for hematuria, penile swelling and penile pain.   Musculoskeletal: Positive for back pain.   Skin: Negative.  Negative for color change and pallor.   Neurological: Positive for numbness (both legs from disc disease.). Negative for dizziness, tremors, seizures and syncope.   Hematological: Does not bruise/bleed easily.   Psychiatric/Behavioral: Negative for confusion, dysphoric mood and decreased concentration.       Objective:   Physical Exam   Constitutional: He appears well-developed and well-nourished.   HENT:   Mouth/Throat: No oropharyngeal exudate.   Eyes: Conjunctivae and EOM are normal. Pupils are equal, round, and reactive to light. No scleral icterus.   Neck: Normal range of motion. No thyromegaly present.   Cardiovascular: Normal rate, regular rhythm and intact distal pulses.    Pulmonary/Chest: Effort normal. He has no wheezes. He exhibits no tenderness.   Abdominal: Soft. He exhibits no mass. There is no tenderness.   Musculoskeletal: Normal range of motion. He exhibits no edema.   Lymphadenopathy:     He has no cervical adenopathy.   Neurological: He is alert. No cranial nerve deficit.   Skin: Skin is warm.   Psychiatric: His behavior is normal.       Assessment:      Encounter Diagnoses   Name Primary?   ??? Routine general medical examination at a health care facility Yes   ??? Prediabetes    ??? Mixed hyperlipidemia    ??? Dyspepsia             Plan:       Bobby HuaDavid was seen today for annual exam.    Diagnoses and associated orders for this visit:    Routine general medical examination at a health care facility    Prediabetes    Mixed hyperlipidemia  - atorvastatin (LIPITOR) 20 MG tablet; Take 1 tablet by mouth daily    Dyspepsia  - pantoprazole (PROTONIX) 40 MG tablet; Take 1 tablet by mouth daily (with breakfast)  - Amoxicillin 500 MG TABS; Take 500 mg by mouth 2 times daily  - azithromycin (ZITHROMAX) 250 MG tablet; As directed.  - pantoprazole (PROTONIX) 40 MG tablet; Take 1 tablet by mouth daily (with breakfast)

## 2014-06-08 NOTE — Progress Notes (Signed)
Per Dr Selena BattenKim corrections were made to the Biaxin XL it was changed to Biaxin 500 mg bid x 14 days and the Amoxicillin 500 mg bid was changed to 1000 mg bid x 14 days.

## 2014-06-08 NOTE — Telephone Encounter (Signed)
Per Dr Selena BattenKim, Biaxin XL changed to Regular Biaxin 500 mg bid and Amoxicillin 500 mg is take 2 tabs(1000 mg) bid x14 days

## 2014-06-08 NOTE — Patient Instructions (Signed)
Please call your pharmacy if you need any refills of your medication(s).    Please call our office at [phone] if you don't hear from us about your test results.                                                                 Bring an accurate list of your medications with you at every appointment to ensure that we have the correct information.    Our office hours are: Monday - Friday 8:30 am- 5 pm    Phone lines turn on at 8:30 am

## 2014-06-08 NOTE — Addendum Note (Signed)
Addended byBrennan Bailey on: 06/08/2014 12:18 PM     Modules accepted: Orders, Medications

## 2014-06-08 NOTE — Addendum Note (Signed)
Addended by: Algis GreenhouseASHCRAFT, Malyna Budney on: 06/08/2014 03:22 PM     Modules accepted: Orders, Medications

## 2014-08-10 ENCOUNTER — Ambulatory Visit
Admit: 2014-08-10 | Discharge: 2014-08-10 | Payer: BLUE CROSS/BLUE SHIELD | Attending: Internal Medicine | Primary: Internal Medicine

## 2014-08-10 DIAGNOSIS — G729 Myopathy, unspecified: Secondary | ICD-10-CM

## 2014-08-10 LAB — POCT URINALYSIS DIPSTICK
Bilirubin, UA: NEGATIVE
Blood, UA POC: NEGATIVE
Glucose, UA POC: NEGATIVE
Ketones, UA: NEGATIVE
Leukocytes, UA: NEGATIVE
Nitrite, UA: NEGATIVE
Protein, UA POC: NEGATIVE
Spec Grav, UA: 1.03
Urobilinogen, UA: 0.2
pH, UA: 6

## 2014-08-10 LAB — CBC
Hematocrit: 46.8 % (ref 40.5–52.5)
Hemoglobin: 16 g/dL (ref 13.5–17.5)
MCH: 28.8 pg (ref 26.0–34.0)
MCHC: 34.2 g/dL (ref 31.0–36.0)
MCV: 84.1 fL (ref 80.0–100.0)
MPV: 8.5 fL (ref 5.0–10.5)
Platelets: 161 10*3/uL (ref 135–450)
RBC: 5.56 M/uL (ref 4.20–5.90)
RDW: 13.8 % (ref 12.4–15.4)
WBC: 4.1 10*3/uL (ref 4.0–11.0)

## 2014-08-10 LAB — LIPID PANEL
Cholesterol, Total: 147 mg/dL (ref 0–199)
HDL: 40 mg/dL (ref 40–60)
LDL Calculated: 84 mg/dL (ref <100)
Triglycerides: 114 mg/dL (ref 0–150)
VLDL Cholesterol Calculated: 23 mg/dL

## 2014-08-10 LAB — COMPREHENSIVE METABOLIC PANEL
ALT: 95 U/L — ABNORMAL HIGH (ref 10–40)
AST: 69 U/L — ABNORMAL HIGH (ref 15–37)
Albumin/Globulin Ratio: 1.6 (ref 1.1–2.2)
Albumin: 4.6 g/dL (ref 3.4–5.0)
Alkaline Phosphatase: 83 U/L (ref 40–129)
Anion Gap: 19 — ABNORMAL HIGH (ref 3–16)
BUN: 14 mg/dL (ref 7–20)
CO2: 20 mmol/L — ABNORMAL LOW (ref 21–32)
Calcium: 9.2 mg/dL (ref 8.3–10.6)
Chloride: 100 mmol/L (ref 99–110)
Creatinine: 0.7 mg/dL — ABNORMAL LOW (ref 0.9–1.3)
GFR African American: >60 (ref >60)
GFR Non-African American: >60 (ref >60)
Globulin: 2.8 g/dL
Glucose: 161 mg/dL — ABNORMAL HIGH (ref 70–99)
Potassium: 4.3 mmol/L (ref 3.5–5.1)
Sodium: 139 mmol/L (ref 136–145)
Total Bilirubin: 0.5 mg/dL (ref 0.0–1.0)
Total Protein: 7.4 g/dL (ref 6.4–8.2)

## 2014-08-10 LAB — CK: Total CK: 453 U/L — ABNORMAL HIGH (ref 39–308)

## 2014-08-10 LAB — TSH: TSH: 4.87 u[IU]/mL — ABNORMAL HIGH (ref 0.27–4.20)

## 2014-08-10 LAB — SEDIMENTATION RATE: Sed Rate: 10 mm/Hr (ref 0–15)

## 2014-08-10 NOTE — Progress Notes (Signed)
Subjective:      Patient ID: Bobby Hall is a 34 y.o. male.    Generalized Body Aches   This is a new problem. The current episode started in the past 7 days. The problem occurs constantly. Associated symptoms include anorexia, arthralgias, chest pain, chills, fatigue, a fever, myalgias, a rash and a sore throat. Pertinent negatives include no coughing or diaphoresis. Exacerbated by: any small movement of muscle. He has tried nothing for the symptoms. The treatment provided no relief.       Review of Systems   Constitutional: Positive for chills, fatigue and fever. Negative for diaphoresis.   HENT: Positive for sore throat. Negative for ear pain, hearing loss and sinus pressure.    Eyes: Negative.    Respiratory: Negative for apnea, cough, choking and stridor.    Cardiovascular: Positive for chest pain.   Gastrointestinal: Positive for anorexia.   Genitourinary: Positive for dysuria. Negative for discharge, hematuria and penile swelling.   Musculoskeletal: Positive for arthralgias and myalgias. Negative for back pain.   Skin: Positive for rash. Negative for color change and pallor.        Rt post thigh.   Neurological: Negative for dizziness, tremors, seizures and syncope.   Hematological: Negative.  Does not bruise/bleed easily.   Psychiatric/Behavioral: Negative.  Negative for confusion, decreased concentration and dysphoric mood.       Objective:   Physical Exam   Musculoskeletal: He exhibits tenderness.   Aching muscles all over including small and large muscles.   Skin:            Assessment:      Encounter Diagnoses   Name Primary?   ??? Myopathy Yes   ??? Viral syndrome    ??? Postviral fatigue syndrome    ??? Pure hypercholesterolemia            Plan:      [name] was seen today for generalized body aches.    Diagnoses and all orders for this visit:    Myopathy    Viral syndrome    Postviral fatigue syndrome    Pure hypercholesterolemia

## 2014-08-10 NOTE — Addendum Note (Signed)
Addended by: Clide DalesBECK, Lucca Greggs on: 08/10/2014 11:18 AM     Modules accepted: Orders

## 2014-08-10 NOTE — Patient Instructions (Signed)
Please call your pharmacy if you need any refills of your medication(s).    Please call our office at [phone] if you don't hear from us about your test results.                                                                 Bring an accurate list of your medications with you at every appointment to ensure that we have the correct information.    Our office hours are: Monday - Friday 8:30 am- 5 pm    Phone lines turn on at 8:30 am

## 2014-08-12 LAB — ALDOLASE: Aldolase: 9 U/L — ABNORMAL HIGH (ref 1.5–8.1)

## 2014-08-12 LAB — MYOGLOBIN, SERUM: Myoglobin: 102 ng/mL — ABNORMAL HIGH (ref 28–72)

## 2014-09-10 ENCOUNTER — Encounter: Attending: Internal Medicine | Primary: Internal Medicine

## 2015-01-07 ENCOUNTER — Encounter

## 2015-01-07 MED ORDER — PANTOPRAZOLE SODIUM 40 MG PO TBEC
40 MG | ORAL_TABLET | ORAL | 5 refills | Status: DC
Start: 2015-01-07 — End: 2015-02-12

## 2015-02-12 MED ORDER — LANSOPRAZOLE 30 MG PO CPDR
30 MG | ORAL_CAPSULE | Freq: Every day | ORAL | 3 refills | Status: DC
Start: 2015-02-12 — End: 2015-08-08

## 2015-02-12 NOTE — Telephone Encounter (Signed)
Pt's insurance will no longer cover pantoprazole. The only med in that class on their preferred list is lansoprazole. Do you want to change or should pt go to OTC meds?

## 2015-03-04 ENCOUNTER — Encounter

## 2015-03-04 MED ORDER — PANTOPRAZOLE SODIUM 40 MG PO TBEC
40 MG | ORAL_TABLET | Freq: Every day | ORAL | 0 refills | Status: DC
Start: 2015-03-04 — End: 2015-08-08

## 2015-05-27 ENCOUNTER — Encounter

## 2015-05-27 MED ORDER — PANTOPRAZOLE SODIUM 40 MG PO TBEC
40 MG | ORAL_TABLET | ORAL | 1 refills | Status: DC
Start: 2015-05-27 — End: 2015-07-08

## 2015-07-08 ENCOUNTER — Encounter

## 2015-07-08 MED ORDER — PANTOPRAZOLE SODIUM 40 MG PO TBEC
40 MG | ORAL_TABLET | ORAL | 1 refills | Status: DC
Start: 2015-07-08 — End: 2015-08-08

## 2015-08-08 ENCOUNTER — Ambulatory Visit
Admit: 2015-08-08 | Discharge: 2015-08-08 | Payer: BLUE CROSS/BLUE SHIELD | Attending: Internal Medicine | Primary: Internal Medicine

## 2015-08-08 DIAGNOSIS — K2100 Gastro-esophageal reflux disease with esophagitis, without bleeding: Secondary | ICD-10-CM

## 2015-08-08 MED ORDER — PANTOPRAZOLE SODIUM 40 MG PO TBEC
40 MG | ORAL_TABLET | Freq: Every day | ORAL | 3 refills | Status: AC
Start: 2015-08-08 — End: ?

## 2015-08-08 NOTE — Patient Instructions (Signed)
Please call your pharmacy if you need any refills of your medication(s).    Please call our office at [phone] if you don't hear from us about your test results.                                                                 Bring an accurate list of your medications with you at every appointment to ensure that we have the correct information.    Our office hours are: Monday - Friday 8:30 am- 5 pm    Phone lines turn on at 8:30 am

## 2015-08-08 NOTE — Progress Notes (Signed)
Subjective:      Patient ID: Bobby Hall is a 35 y.o. male.    HPI Comments: Patient presents with:  3 Month Follow-Up: med refill on protonix.     Bobby Hall is a 35 y.o. male with the following history as recorded in [employer]:  Patient Active Problem List    High triglycerides         Date Noted: 06/07/2014      GERD (gastroesophageal reflux disease)      Back pain         Date Noted: 09/20/2008      Current Outpatient Prescriptions:  pantoprazole (PROTONIX) 40 MG tablet, TAKE 1 TABLET BY MOUTH DAILY (WITH BREAKFAST), Disp: 30 tablet, Rfl: 1  gabapentin (NEURONTIN) 300 MG capsule, , Disp: , Rfl: 0    No current facility-administered medications for this visit.     Allergies: Lipitor (atorvastatin) and Avelox (moxifloxacin hydrochloride)  Past Medical History:  09/20/2008: Back pain      Comment: Workers Comp  No date: Chronic back pain      Comment: Workers Comp  No date: GERD (gastroesophageal reflux disease)  No date: Herniated disc      Comment: Workers Comp  06/07/2014: High triglycerides  35 years old: Pneumonia  No date: Pre-diabetes  Past Surgical History:  No date: BACK SURGERY      Comment: Workers Comp  No date: FINGER SURGERY      Comment: rt ring finger  Review of patient's family history indicates:    High Blood Pressure            Mother                    Diabetes                       Mother                      Comment: borderline    Diabetes                       Father                    Sudden Death                   Sister                    Substance Abuse                Sister                    Smoking status: Never Smoker                                                              Smokeless status: Never Used                      Alcohol use: No                  Review of Systems   Constitutional: Negative for appetite change.   Respiratory: Negative.    Gastrointestinal: Positive for abdominal  pain and diarrhea. Negative for nausea and vomiting.        Belching gas and swelling.    Genitourinary: Negative.  Negative for frequency.   Musculoskeletal: Positive for back pain. Negative for myalgias.   Skin: Negative.    Psychiatric/Behavioral: Negative for sleep disturbance.       Objective:   Physical Exam   Constitutional: He appears well-developed and well-nourished.   HENT:   Mouth/Throat: No oropharyngeal exudate.   Eyes: Conjunctivae and EOM are normal. Pupils are equal, round, and reactive to light. No scleral icterus.   Neck: Normal range of motion. No thyromegaly present.   Cardiovascular: Normal rate, regular rhythm and intact distal pulses.    Pulmonary/Chest: Effort normal. He has no wheezes. He exhibits no tenderness.   Abdominal: Soft. He exhibits no mass. There is tenderness.   Musculoskeletal: Normal range of motion. He exhibits no edema.   Lymphadenopathy:     He has no cervical adenopathy.   Neurological: He is alert. No cranial nerve deficit.   Skin: Skin is warm.   Psychiatric: His behavior is normal.       Assessment:      Encounter Diagnoses   Name Primary?   ??? Dyspepsia    ??? Gastroesophageal reflux disease with esophagitis Yes           Plan:      [name] was seen today for 3 month follow-up.    Diagnoses and all orders for this visit:    Gastroesophageal reflux disease with esophagitis    Dyspepsia  -     pantoprazole (PROTONIX) 40 MG tablet; Take 1 tablet by mouth daily  -     Amb External Referral To Gastroenterology    failed with antibiotic treatment for H.Pyroli infection. Needs referral to GI for scopic ex.

## 2016-02-08 ENCOUNTER — Ambulatory Visit: Primary: Internal Medicine

## 2016-02-08 ENCOUNTER — Encounter

## 2016-02-08 ENCOUNTER — Inpatient Hospital Stay: Attending: Neurological Surgery | Primary: Internal Medicine

## 2016-02-08 ENCOUNTER — Encounter: Primary: Internal Medicine

## 2016-02-08 DIAGNOSIS — M48062 Spinal stenosis, lumbar region with neurogenic claudication: Secondary | ICD-10-CM

## 2017-04-26 ENCOUNTER — Ambulatory Visit
Admit: 2017-04-26 | Discharge: 2017-04-26 | Payer: BLUE CROSS/BLUE SHIELD | Attending: Internal Medicine | Primary: Internal Medicine

## 2017-04-26 DIAGNOSIS — R42 Dizziness and giddiness: Secondary | ICD-10-CM

## 2017-04-26 LAB — POCT URINALYSIS DIPSTICK
Bilirubin, UA: NEGATIVE
Blood, UA POC: NEGATIVE
Glucose, UA POC: NEGATIVE
Ketones, UA: NEGATIVE
Leukocytes, UA: NEGATIVE
Nitrite, UA: NEGATIVE
Protein, UA POC: NEGATIVE
Spec Grav, UA: 1.025
Urobilinogen, UA: 0.2
pH, UA: 5

## 2017-04-26 LAB — COMPREHENSIVE METABOLIC PANEL
ALT: 59 U/L — ABNORMAL HIGH (ref 10–40)
AST: 36 U/L (ref 15–37)
Albumin/Globulin Ratio: 1.7 (ref 1.1–2.2)
Albumin: 4.7 g/dL (ref 3.4–5.0)
Alkaline Phosphatase: 72 U/L (ref 40–129)
Anion Gap: 14 (ref 3–16)
BUN: 10 mg/dL (ref 7–20)
CO2: 27 mmol/L (ref 21–32)
Calcium: 9.7 mg/dL (ref 8.3–10.6)
Chloride: 102 mmol/L (ref 99–110)
Creatinine: 0.8 mg/dL — ABNORMAL LOW (ref 0.9–1.3)
GFR African American: >60 (ref >60)
GFR Non-African American: >60 (ref >60)
Globulin: 2.8 g/dL
Glucose: 127 mg/dL — ABNORMAL HIGH (ref 70–99)
Potassium: 4.4 mmol/L (ref 3.5–5.1)
Sodium: 143 mmol/L (ref 136–145)
Total Bilirubin: 0.6 mg/dL (ref 0.0–1.0)
Total Protein: 7.5 g/dL (ref 6.4–8.2)

## 2017-04-26 LAB — CBC
Hematocrit: 48.7 % (ref 40.5–52.5)
Hemoglobin: 16.4 g/dL (ref 13.5–17.5)
MCH: 29 pg (ref 26.0–34.0)
MCHC: 33.6 g/dL (ref 31.0–36.0)
MCV: 86.3 fL (ref 80.0–100.0)
MPV: 8.8 fL (ref 5.0–10.5)
Platelets: 189 10*3/uL (ref 135–450)
RBC: 5.64 M/uL (ref 4.20–5.90)
RDW: 13.7 % (ref 12.4–15.4)
WBC: 7.3 10*3/uL (ref 4.0–11.0)

## 2017-04-26 LAB — LIPID PANEL
Cholesterol, Total: 189 mg/dL (ref 0–199)
HDL: 43 mg/dL (ref 40–60)
LDL Calculated: 122 mg/dL — ABNORMAL HIGH (ref <100)
Triglycerides: 119 mg/dL (ref 0–150)
VLDL Cholesterol Calculated: 24 mg/dL

## 2017-04-26 LAB — MICROALBUMIN / CREATININE URINE RATIO
Creatinine, Ur: 173.1 mg/dL (ref 39.0–259.0)
Microalbumin Creatinine Ratio: 8.1 mg/g (ref 0.0–30.0)
Microalbumin, Random Urine: 1.4 mg/dL (ref <2.0)

## 2017-04-26 LAB — TSH: TSH: 2.5 u[IU]/mL (ref 0.27–4.20)

## 2017-04-26 MED ORDER — LANCETS MISC
3 refills | Status: AC
Start: 2017-04-26 — End: ?

## 2017-04-26 MED ORDER — BLOOD GLUCOSE TEST VI STRP
ORAL_STRIP | 3 refills | Status: DC
Start: 2017-04-26 — End: 2018-05-23

## 2017-04-26 NOTE — Patient Instructions (Signed)
Please call your pharmacy if you need any refills of your medication(s).    Please call our office at [phone] if you don't hear from us about your test results.                                                                 Bring an accurate list of your medications with you at every appointment to ensure that we have the correct information.    Our office hours are: Monday - Friday 8:30 am- 5 pm    Phone lines turn on at 8:30 am

## 2017-04-26 NOTE — Progress Notes (Signed)
Subjective:      Patient ID: Bobby Hall is a 37 y.o. male.    Patient presents with:  Dizziness: light headedness spells off and on for about a month. no LOC or falls.  denies visual disturbance, but has "floater" in left eye.  sometimes after dizzy spells, he feels very fatigued.  also mentioned some shaking of hands. pt is fasting. family hx of diabetes.     Bobby Hall is a 37 y.o. male with the following history as recorded in [employer]:  Patient Active Problem List    High triglycerides         Date Noted: 06/07/2014      GERD (gastroesophageal reflux disease)      Back pain         Date Noted: 09/20/2008      Current Outpatient Medications:  pantoprazole (PROTONIX) 40 MG tablet, Take 1 tablet by mouth daily, Disp: 90 tablet, Rfl: 3    No current facility-administered medications for this visit.     Allergies: Lipitor (atorvastatin) and Avelox (moxifloxacin hydrochloride)  Past Medical History:  09/20/2008: Back pain      Comment:  Workers Comp  No date: Chronic back pain      Comment:  Workers Comp  No date: GERD (gastroesophageal reflux disease)  No date: Herniated disc      Comment:  Workers Comp  06/07/2014: High triglycerides  37 years old: Pneumonia  No date: Pre-diabetes  Past Surgical History:  No date: BACK SURGERY      Comment:  Workers Comp  No date: FINGER SURGERY      Comment:  rt ring finger  Review of patient's family history indicates:  Problem: High Blood Pressure      Relation: Mother          Age of Onset: (Not Specified)  Problem: Diabetes      Relation: Mother          Age of Onset: (Not Specified)          Comment: borderline  Problem: Diabetes      Relation: Father          Age of Onset: (Not Specified)  Problem: Heart Attack      Relation: Father          Age of Onset: (Not Specified)  Problem: Sudden Death      Relation: Sister          Age of Onset: (Not Specified)  Problem: Substance Abuse      Relation: Sister          Age of Onset: (Not Specified)    Social History    Tobacco  Use      Smoking status: Never Smoker      Smokeless tobacco: Never Used    Alcohol use: No      Dizziness   This is a new problem. The current episode started more than 1 month ago. The problem occurs constantly. The problem has been gradually worsening. Associated symptoms include fatigue, headaches and weakness. Pertinent negatives include no chest pain, chills or visual change. The symptoms are aggravated by stress. He has tried nothing for the symptoms. The treatment provided no relief.       Review of Systems   Constitutional: Positive for fatigue. Negative for chills.   HENT: Negative for hearing loss and tinnitus.    Eyes: Negative for visual disturbance.   Respiratory: Negative.    Cardiovascular: Negative.  Negative for chest pain.   Gastrointestinal: Negative.    Genitourinary: Negative.    Musculoskeletal: Positive for back pain.   Skin: Negative.    Neurological: Positive for dizziness, tremors (lt one is worse hand.), weakness and headaches.   Psychiatric/Behavioral: The patient is nervous/anxious.        Objective:   Physical Exam   HENT:   Head: Normocephalic.   Right Ear: External ear normal.   Left Ear: External ear normal.   Nose: Nose normal.   Mouth/Throat: Oropharynx is clear and moist. No oropharyngeal exudate.   [name] full of wax.   Eyes: Pupils are equal, round, and reactive to light. Conjunctivae and EOM are normal. No scleral icterus.   Neck: Normal range of motion. No thyromegaly present.   Cardiovascular: Normal rate and regular rhythm.   No murmur heard.  Pulmonary/Chest: Effort normal. He has no wheezes.   Abdominal: Soft. Bowel sounds are normal. There is no tenderness.   Musculoskeletal: He exhibits no edema or tenderness.   Lymphadenopathy:     He has no cervical adenopathy.   Neurological: He is alert.   Skin: Skin is warm. He is not diaphoretic.       Assessment:      Encounter Diagnoses   Name Primary?   . Dizziness Yes   . Excessive cerumen in left ear canal    . Type 2  diabetes mellitus without complication, without long-term current use of insulin (HCC)            Plan:      Bobby Hall was seen today for dizziness.    Diagnoses and all orders for this visit:    Dizziness  -     CBC    Excessive cerumen in left ear canal    Type 2 diabetes mellitus without complication, without long-term current use of insulin (HCC)  -     Comprehensive Metabolic Panel  -     CBC  -     Hemoglobin A1C  -     Lipid Panel  -     TSH without Reflex  -     POCT Urinalysis no Micro  -     Cancel: POCT microalbumin  -     Microalbumin / Creatinine Urine Ratio    Other orders  -     blood glucose monitor strips; Test 1 times a day & as needed for symptoms of irregular blood glucose. Contour glucometer.  -     Lancets MISC; Test once a day.      Will clean impacted Cerumen after used Deborax. May need metformin if A1c abd FBS is high.        Anselm Pancoast, MD

## 2017-04-27 LAB — HEMOGLOBIN A1C
Hemoglobin A1C: 7 %
eAG: 154.2 mg/dL

## 2017-04-29 ENCOUNTER — Ambulatory Visit
Admit: 2017-04-29 | Discharge: 2017-04-29 | Payer: BLUE CROSS/BLUE SHIELD | Attending: Internal Medicine | Primary: Internal Medicine

## 2017-04-29 DIAGNOSIS — G4489 Other headache syndrome: Secondary | ICD-10-CM

## 2017-04-29 MED ORDER — METFORMIN HCL ER 500 MG PO TB24
500 MG | ORAL_TABLET | Freq: Every day | ORAL | 3 refills | Status: DC
Start: 2017-04-29 — End: 2017-08-25

## 2017-04-29 NOTE — Patient Instructions (Signed)
Please call your pharmacy if you need any refills of your medication(s).    Please call our office at [phone] if you don't hear from us about your test results.                                                                 Bring an accurate list of your medications with you at every appointment to ensure that we have the correct information.    Our office hours are: Monday - Friday 8:30 am- 5 pm    Phone lines turn on at 8:30 am

## 2017-04-29 NOTE — Progress Notes (Signed)
Subjective:      Patient ID: Bobby Hall is a 37 y.o. male.    Patient presents with:  Cerumen Impaction: clean out ears. discuss labs.     Bobby Hall is a 37 y.o. male with the following history as recorded in Epic[facility]:  Patient Active Problem List    High triglycerides         Date Noted: 06/07/2014      GERD (gastroesophageal reflux disease)      Back pain         Date Noted: 09/20/2008      Current Outpatient Medications:  metFORMIN (GLUCOPHAGE XR) 500 MG extended release tablet, Take 1 tablet by mouth daily (with breakfast), Disp: 30 tablet, Rfl: 3  blood glucose monitor strips, Test 1 times a day & as needed for symptoms of irregular blood glucose. Contour glucometer., Disp: 100 strip, Rfl: 3  Lancets MISC, Test once a day., Disp: 100 each, Rfl: 3  pantoprazole (PROTONIX) 40 MG tablet, Take 1 tablet by mouth daily, Disp: 90 tablet, Rfl: 3    No current facility-administered medications for this visit.     Allergies: Lipitor (atorvastatin) and Avelox (moxifloxacin hydrochloride)  Past Medical History:  09/20/2008: Back pain      Comment:  Workers Comp  No date: Chronic back pain      Comment:  Workers Comp  No date: GERD (gastroesophageal reflux disease)  No date: Herniated disc      Comment:  Workers Comp  06/07/2014: High triglycerides  37 years old: Pneumonia  No date: Pre-diabetes  Past Surgical History:  No date: BACK SURGERY      Comment:  Workers Comp  No date: FINGER SURGERY      Comment:  rt ring finger  Review of patient's family history indicates:  Problem: High Blood Pressure      Relation: Mother          Age of Onset: (Not Specified)  Problem: Diabetes      Relation: Mother          Age of Onset: (Not Specified)          Comment: borderline  Problem: Diabetes      Relation: Father          Age of Onset: (Not Specified)  Problem: Heart Attack      Relation: Father          Age of Onset: (Not Specified)  Problem: Sudden Death      Relation: Sister          Age of Onset: (Not  Specified)  Problem: Substance Abuse      Relation: Sister          Age of Onset: (Not Specified)    Social History    Tobacco Use      Smoking status: Never Smoker      Smokeless tobacco: Never Used    Alcohol use: No      Headache    This is a new problem. The current episode started more than 1 month ago. The problem occurs intermittently. The problem has been gradually worsening. The pain is located in the bilateral region. The pain does not radiate. The pain quality is not similar to prior headaches. The quality of the pain is described as shooting and band-like. The pain is at a severity of 7/10. Associated symptoms include dizziness and hearing loss. Pertinent negatives include no abdominal pain, fever, insomnia, sinus pressure or weakness.  Exacerbated by: head injury about 10 weeks ago. hit by the cow . He has tried nothing for the symptoms.       Review of Systems   Constitutional: Negative for fever.   HENT: Positive for hearing loss. Negative for sinus pressure.    Gastrointestinal: Negative for abdominal pain.   Neurological: Positive for dizziness and headaches. Negative for weakness.   Psychiatric/Behavioral: The patient does not have insomnia.        Objective:   Physical Exam   Constitutional: He appears well-developed and well-nourished.   HENT:   Head: Normocephalic and atraumatic.   Right Ear: External ear normal.   Left Ear: External ear normal.   Nose: Nose normal.   Mouth/Throat: Oropharynx is clear and moist.   [name] wax full.   Eyes: Conjunctivae are normal. Right eye exhibits no discharge. Left eye exhibits no discharge. No scleral icterus.   Neck: Normal range of motion. Neck supple. No JVD present. No tracheal deviation present. No thyromegaly present.   Cardiovascular: Normal rate, regular rhythm, normal heart sounds and intact distal pulses. Exam reveals no gallop and no friction rub.   No murmur heard.  Pulmonary/Chest: Effort normal and breath sounds normal. No stridor. No respiratory  distress. He has no wheezes. He has no rales. He exhibits no tenderness.   Abdominal: Soft. Bowel sounds are normal. He exhibits no distension and no mass. There is no tenderness. There is no rebound and no guarding.   Genitourinary: Rectum normal, prostate normal and penis normal. Rectal exam shows guaiac negative stool. No penile tenderness.   Musculoskeletal: He exhibits no edema or tenderness.   Lymphadenopathy:     He has no cervical adenopathy.   Neurological: He has normal reflexes. He displays normal reflexes. No cranial nerve deficit. He exhibits normal muscle tone. Coordination normal.   Skin: Skin is warm and dry. No rash noted. No erythema. No pallor.   Psychiatric: He has a normal mood and affect. His behavior is normal. Judgment and thought content normal.       Assessment:      Encounter Diagnoses   Name Primary?   ??? Other headache syndrome Yes   ??? Type 2 diabetes mellitus without complication, without long-term current use of insulin ([place])    ??? Excessive cerumen in left ear canal    ??? Injury of head, initial encounter    ??? Dizzy            Plan:           Bobby Hall was seen today for cerumen impaction.    Diagnoses and all orders for this visit:    Other headache syndrome  -     CT Head WO Contrast; Future    Type 2 diabetes mellitus without complication, without long-term current use of insulin ([facility])  -     metFORMIN (GLUCOPHAGE XR) 500 MG extended release tablet; Take 1 tablet by mouth daily (with breakfast)    Excessive cerumen in left ear canal  -     PR REMOVAL IMPACTED CERUMEN INSTRUMENTATION UNILAT    Injury of head, initial encounter  -     CT Head WO Contrast; Future    Dizzy  -     CT Head WO Contrast; Future    started Metformin 516m daily for diabetes.  [name] was found to be impacted with wax. Tympanic membranes were not able to be seen. Hearing loss and some ear ache. Irrigation method and ear  wax curette use to remove cerumen from lt ear.     metformin 591m started for high  sugar.     Bobby Bailey MD

## 2017-05-05 ENCOUNTER — Ambulatory Visit: Payer: BLUE CROSS/BLUE SHIELD | Primary: Internal Medicine

## 2017-05-10 ENCOUNTER — Telehealth

## 2017-05-10 NOTE — Telephone Encounter (Signed)
Patient having dizziness, headaches and hand tremors, at last visit it was mentioned he may need a referral.  Wants to know who the referral is to?  CB [phone]

## 2017-06-17 ENCOUNTER — Ambulatory Visit
Admit: 2017-06-17 | Discharge: 2017-06-17 | Payer: BLUE CROSS/BLUE SHIELD | Attending: Internal Medicine | Primary: Internal Medicine

## 2017-06-17 DIAGNOSIS — R102 Pelvic and perineal pain: Secondary | ICD-10-CM

## 2017-06-17 MED ORDER — HYOSCYAMINE SULFATE SL 0.125 MG SL SUBL
0.125 MG | Freq: Three times a day (TID) | SUBLINGUAL | 1 refills | Status: DC | PRN
Start: 2017-06-17 — End: 2017-11-08

## 2017-06-17 NOTE — Progress Notes (Signed)
Subjective:      Patient ID: Bobby Hall is a 37 y.o. male.    Bobby Hall is a 37 y.o. male with the following history as recorded in EpicCare:  Patient Active Problem List    High triglycerides         Date Noted: 06/07/2014      GERD (gastroesophageal reflux disease)      Back pain         Date Noted: 09/20/2008      Current Outpatient Medications:  Hyoscyamine Sulfate SL (LEVSIN/SL) 0.125 MG SUBL, Take 0.125 mg by mouth 3 times daily as needed (rectal pain), Disp: 30 each, Rfl: 1  metFORMIN (GLUCOPHAGE XR) 500 MG extended release tablet, Take 1 tablet by mouth daily (with breakfast), Disp: 30 tablet, Rfl: 3  blood glucose monitor strips, Test 1 times a day & as needed for symptoms of irregular blood glucose. Contour glucometer., Disp: 100 strip, Rfl: 3  Lancets MISC, Test once a day., Disp: 100 each, Rfl: 3  pantoprazole (PROTONIX) 40 MG tablet, Take 1 tablet by mouth daily, Disp: 90 tablet, Rfl: 3    No current facility-administered medications for this visit.     Allergies: Lipitor (atorvastatin) and Avelox (moxifloxacin hydrochloride)  Past Medical History:  09/20/2008: Back pain      Comment:  Workers Comp  No date: Chronic back pain      Comment:  Workers Comp  No date: GERD (gastroesophageal reflux disease)  No date: Herniated disc      Comment:  Workers Comp  06/07/2014: High triglycerides  37 years old: Pneumonia  No date: Pre-diabetes  Past Surgical History:  No date: BACK SURGERY      Comment:  Workers Comp  No date: FINGER SURGERY      Comment:  rt ring finger  Review of patient's family history indicates:  Problem: High Blood Pressure      Relation: Mother          Age of Onset: (Not Specified)  Problem: Diabetes      Relation: Mother          Age of Onset: (Not Specified)          Comment: borderline  Problem: Diabetes      Relation: Father          Age of Onset: (Not Specified)  Problem: Heart Attack      Relation: Father          Age of Onset: (Not Specified)  Problem: Sudden Death       Relation: Sister          Age of Onset: (Not Specified)  Problem: Substance Abuse      Relation: Sister          Age of Onset: (Not Specified)    Social History    Tobacco Use      Smoking status: Never Smoker      Smokeless tobacco: Never Used    Alcohol use: No        Review of Systems    Objective:   Physical Exam    Assessment:            Plan:              Anselm Pancoast, MD

## 2017-06-17 NOTE — Progress Notes (Signed)
Subjective:      Patient ID: Bobby Hall is a 37 y.o. male.    Patient presents with:  Lower pelvic pain like hemorrhoidal pain but different.   Medication Check: pt states sugar stays the same whether he takes metformin or not.  anywhere between 105 and 130 in the mornings. 90's in the evening.  was out for a week or so, no change in sugars. does he have to continue metformin?    Jerol Rufener is a 37 y.o. male with the following history as recorded in EpicCare:  Patient Active Problem List    High triglycerides         Date Noted: 06/07/2014      GERD (gastroesophageal reflux disease)      Back pain         Date Noted: 09/20/2008      Current Outpatient Medications:  Hyoscyamine Sulfate SL (LEVSIN/SL) 0.125 MG SUBL, Take 0.125 mg by mouth 3 times daily as needed (rectal pain), Disp: 30 each, Rfl: 1  metFORMIN (GLUCOPHAGE XR) 500 MG extended release tablet, Take 1 tablet by mouth daily (with breakfast), Disp: 30 tablet, Rfl: 3  blood glucose monitor strips, Test 1 times a day & as needed for symptoms of irregular blood glucose. Contour glucometer., Disp: 100 strip, Rfl: 3  Lancets MISC, Test once a day., Disp: 100 each, Rfl: 3  pantoprazole (PROTONIX) 40 MG tablet, Take 1 tablet by mouth daily, Disp: 90 tablet, Rfl: 3    No current facility-administered medications for this visit.     Allergies: Lipitor (atorvastatin) and Avelox (moxifloxacin hydrochloride)  Past Medical History:  09/20/2008: Back pain      Comment:  Workers Comp  No date: Chronic back pain      Comment:  Workers Comp  No date: GERD (gastroesophageal reflux disease)  No date: Herniated disc      Comment:  Workers Comp  06/07/2014: High triglycerides  37 years old: Pneumonia  No date: Pre-diabetes  Past Surgical History:  No date: BACK SURGERY      Comment:  Workers Comp  No date: FINGER SURGERY      Comment:  rt ring finger  Review of patient's family history indicates:  Problem: High Blood Pressure      Relation: Mother          Age of Onset:  (Not Specified)  Problem: Diabetes      Relation: Mother          Age of Onset: (Not Specified)          Comment: borderline  Problem: Diabetes      Relation: Father          Age of Onset: (Not Specified)  Problem: Heart Attack      Relation: Father          Age of Onset: (Not Specified)  Problem: Sudden Death      Relation: Sister          Age of Onset: (Not Specified)  Problem: Substance Abuse      Relation: Sister          Age of Onset: (Not Specified)    Social History    Tobacco Use      Smoking status: Never Smoker      Smokeless tobacco: Never Used    Alcohol use: No      Pelvic Pain   This is a new problem. The current episode started more than 1  month ago. The problem occurs constantly. The problem has been unchanged. Associated symptoms include urinary symptoms. Associated symptoms comments: Lower back pain.. Exacerbated by: sitting. He has tried nothing for the symptoms. The treatment provided no relief.       Review of Systems   Constitutional: Negative.    HENT: Negative for ear pain, hearing loss and sinus pressure.    Eyes: Negative.    Respiratory: Negative for apnea, choking and stridor.    Gastrointestinal: Negative for anal bleeding and blood in stool.        Pain at perianal area.   Genitourinary: Positive for pelvic pain. Negative for discharge, hematuria, penile pain and penile swelling.   Musculoskeletal: Positive for back pain.        Lower back surgery x2.   Skin: Negative for color change and pallor.   Neurological: Negative for dizziness, tremors, seizures and syncope.   Hematological: Does not bruise/bleed easily.   Psychiatric/Behavioral: Negative for confusion, decreased concentration and dysphoric mood. The patient is not nervous/anxious.        Objective:   Physical Exam   Constitutional: He appears well-developed and well-nourished.   HENT:   Head: Normocephalic and atraumatic.   Right Ear: External ear normal.   Left Ear: External ear normal.   Nose: Nose normal.   Mouth/Throat:  Oropharynx is clear and moist.   Eyes: Conjunctivae are normal. Right eye exhibits no discharge. Left eye exhibits no discharge. No scleral icterus.   Neck: Normal range of motion. Neck supple. No JVD present. No tracheal deviation present. No thyromegaly present.   Cardiovascular: Normal rate, regular rhythm, normal heart sounds and intact distal pulses. Exam reveals no gallop and no friction rub.   No murmur heard.  Pulmonary/Chest: Effort normal and breath sounds normal. No stridor. No respiratory distress. He has no wheezes. He has no rales. He exhibits no tenderness.   Abdominal: Soft. Bowel sounds are normal. He exhibits no distension and no mass. There is no tenderness. There is no rebound and no guarding.   Genitourinary: Rectum normal, prostate normal and penis normal. Rectal exam shows guaiac negative stool. No penile tenderness.   Genitourinary Comments: Small size non bleeding hemorrhoid. Very tight rectal spinctor muscles.   Musculoskeletal: He exhibits no edema or tenderness.   Lymphadenopathy:     He has no cervical adenopathy.   Neurological: He has normal reflexes. He displays normal reflexes. No cranial nerve deficit. He exhibits normal muscle tone. Coordination normal.   Skin: Skin is warm and dry. No rash noted. No erythema. No pallor.   Psychiatric: He has a normal mood and affect. His behavior is normal. Judgment and thought content normal.       Assessment:      Encounter Diagnoses   Name Primary?   ??? Pelvic pain Yes   ??? Type 2 diabetes mellitus without complication, without long-term current use of insulin ([place])            Plan:      Juriel was seen today for hemorrhoids and medication check.    Diagnoses and all orders for this visit:    Pelvic pain    Type 2 diabetes mellitus without complication, without long-term current use of insulin ([facility])    Other orders  -     Hyoscyamine Sulfate SL (LEVSIN/SL) 0.125 MG SUBL; Take 0.125 mg by mouth 3 times daily as needed (rectal pain)      Refer him  to his neurosurgeon for possible  related from back surgery.  Diabetes is well controlled but he needs Metformin treatment for even prevention.        Brennan Bailey, MD

## 2017-06-17 NOTE — Patient Instructions (Signed)
Please call your pharmacy if you need any refills of your medication(s).    Please call our office at [phone] if you don't hear from us about your test results.                                                                 Bring an accurate list of your medications with you at every appointment to ensure that we have the correct information.    Our office hours are: Monday - Friday 8:30 am- 5 pm    Phone lines turn on at 8:30 am

## 2017-08-25 ENCOUNTER — Encounter

## 2017-08-25 NOTE — Telephone Encounter (Signed)
Last OV 06/17/17 Next OV 10/29/17

## 2017-08-26 MED ORDER — METFORMIN HCL ER 500 MG PO TB24
500 MG | ORAL_TABLET | ORAL | 1 refills | Status: DC
Start: 2017-08-26 — End: 2018-02-22

## 2017-10-29 ENCOUNTER — Encounter: Attending: Internal Medicine | Primary: Internal Medicine

## 2017-11-08 ENCOUNTER — Ambulatory Visit
Admit: 2017-11-08 | Discharge: 2017-11-08 | Payer: BLUE CROSS/BLUE SHIELD | Attending: Internal Medicine | Primary: Internal Medicine

## 2017-11-08 DIAGNOSIS — E119 Type 2 diabetes mellitus without complications: Secondary | ICD-10-CM

## 2017-11-08 LAB — POCT URINALYSIS DIPSTICK
Bilirubin, UA: NEGATIVE
Blood, UA POC: NEGATIVE
Glucose, UA POC: NEGATIVE
Ketones, UA: NEGATIVE
Leukocytes, UA: NEGATIVE
Nitrite, UA: NEGATIVE
Protein, UA POC: NEGATIVE
Spec Grav, UA: 1.025
Urobilinogen, UA: 0.2
pH, UA: 5

## 2017-11-08 MED ORDER — LOSARTAN POTASSIUM 25 MG PO TABS
25 MG | ORAL_TABLET | Freq: Every day | ORAL | 3 refills | Status: AC
Start: 2017-11-08 — End: ?

## 2017-11-08 NOTE — Progress Notes (Signed)
Subjective:      Patient ID: Bobby Hall is a 37 y.o. male.    Patient presents with:  Diabetes: diabetic check up, needs lab orders, not fasting today.     Bobby Hall is a 37 y.o. male with the following history as recorded in EpicCare:  Patient Active Problem List    High triglycerides         Date Noted: 06/07/2014      GERD (gastroesophageal reflux disease)      Back pain         Date Noted: 09/20/2008      Current Outpatient Medications:  metFORMIN (GLUCOPHAGE-XR) 500 MG extended release tablet, TAKE 1 TABLET BY MOUTH EVERY DAY WITH BREAKFAST, Disp: 90 tablet, Rfl: 1  blood glucose monitor strips, Test 1 times a day & as needed for symptoms of irregular blood glucose. Contour glucometer., Disp: 100 strip, Rfl: 3  Lancets MISC, Test once a day., Disp: 100 each, Rfl: 3  pantoprazole (PROTONIX) 40 MG tablet, Take 1 tablet by mouth daily, Disp: 90 tablet, Rfl: 3    No current facility-administered medications for this visit.     Allergies: Lipitor (atorvastatin) and Avelox (moxifloxacin hydrochloride)  Past Medical History:  09/20/2008: Back pain      Comment:  Workers Comp  No date: Chronic back pain      Comment:  Workers Comp  No date: GERD (gastroesophageal reflux disease)  No date: Herniated disc      Comment:  Workers Comp  06/07/2014: High triglycerides  37 years old: Pneumonia  No date: Pre-diabetes  Past Surgical History:  No date: BACK SURGERY      Comment:  Workers Comp  No date: FINGER SURGERY      Comment:  rt ring finger  Review of patient's family history indicates:  Problem: High Blood Pressure      Relation: Mother          Age of Onset: (Not Specified)  Problem: Diabetes      Relation: Mother          Age of Onset: (Not Specified)          Comment: borderline  Problem: Diabetes      Relation: Father          Age of Onset: (Not Specified)  Problem: Heart Attack      Relation: Father          Age of Onset: (Not Specified)  Problem: Sudden Death      Relation: Sister          Age of Onset:  (Not Specified)  Problem: Substance Abuse      Relation: Sister          Age of Onset: (Not Specified)    Social History    Tobacco Use      Smoking status: Never Smoker      Smokeless tobacco: Never Used    Alcohol use: No      Diabetes   He presents for his follow-up diabetic visit. The initial diagnosis of diabetes was made 6 months ago. His disease course has been stable. Hypoglycemia symptoms include hunger and tremors. Pertinent negatives for hypoglycemia include no confusion, dizziness, pallor or seizures. Pertinent negatives for diabetes include no blurred vision, no polyphagia and no polyuria. Symptoms are stable. There are no diabetic complications. Risk factors for coronary artery disease include obesity. He is following a generally healthy and low fat/cholesterol diet. He rarely participates  in exercise. His breakfast blood glucose range is generally 110-130 mg/dl. An ACE inhibitor/angiotensin II receptor blocker is being taken. Eye exam is not current.       Review of Systems   Constitutional: Negative.  Negative for chills.   HENT: Negative.    Eyes: Negative.  Negative for blurred vision.   Endocrine: Negative for polyphagia and polyuria.   Genitourinary: Negative for discharge, hematuria, penile pain and penile swelling.   Musculoskeletal: Negative for back pain.   Skin: Negative for color change and pallor.   Neurological: Positive for tremors. Negative for dizziness, seizures and syncope.   Hematological: Does not bruise/bleed easily.   Psychiatric/Behavioral: Negative for confusion, decreased concentration and dysphoric mood.       Objective:   Physical Exam   Constitutional: He appears well-developed and well-nourished.   HENT:   Head: Normocephalic and atraumatic.   Right Ear: External ear normal.   Left Ear: External ear normal.   Nose: Nose normal.   Mouth/Throat: Oropharynx is clear and moist.   Eyes: Conjunctivae are normal. Right eye exhibits no discharge. Left eye exhibits no discharge. No  scleral icterus.   Neck: Normal range of motion. Neck supple. No JVD present. No tracheal deviation present. No thyromegaly present.   Cardiovascular: Normal rate, regular rhythm, normal heart sounds and intact distal pulses. Exam reveals no gallop and no friction rub.   No murmur heard.  Pulmonary/Chest: Effort normal and breath sounds normal. No stridor. No respiratory distress. He has no wheezes. He has no rales. He exhibits no tenderness.   Abdominal: Soft. Bowel sounds are normal. He exhibits no distension and no mass. There is no tenderness. There is no rebound and no guarding.   Genitourinary: Rectum normal, prostate normal and penis normal. Rectal exam shows guaiac negative stool. No penile tenderness.   Musculoskeletal: He exhibits no edema or tenderness.   Lymphadenopathy:     He has no cervical adenopathy.   Neurological: He has normal reflexes. He displays normal reflexes. No cranial nerve deficit. He exhibits normal muscle tone. Coordination normal.   Skin: Skin is warm and dry. No rash noted. No erythema. No pallor.   Psychiatric: He has a normal mood and affect. His behavior is normal. Judgment and thought content normal.       Assessment:      Encounter Diagnoses   Name Primary?   ??? Type 2 diabetes mellitus without complication, without long-term current use of insulin ([facility]) Yes   ??? High triglycerides            Plan:      Alp was seen today for diabetes.    Diagnoses and all orders for this visit:    Type 2 diabetes mellitus without complication, without long-term current use of insulin ([facility])  -     Comprehensive Metabolic Panel  -     CBC  -     Hemoglobin A1C  -     TSH without Reflex  -     losartan (COZAAR) 25 MG tablet; Take 1 tablet by mouth daily    High triglycerides  -     Lipid Panel  -     TSH without Reflex              Brennan Bailey, MD

## 2017-11-08 NOTE — Addendum Note (Signed)
Addended by: Clide Dales on: 11/08/2017 11:39 AM     Modules accepted: Orders

## 2017-11-08 NOTE — Patient Instructions (Signed)
Please call your pharmacy if you need any refills of your medication(s).    Please call our office at [phone] if you don't hear from us about your test results.                                                                 Bring an accurate list of your medications with you at every appointment to ensure that we have the correct information.    Our office hours are: Monday - Friday 8:30 am- 5 pm    Phone lines turn on at 8:30 am

## 2017-11-15 ENCOUNTER — Telehealth

## 2017-11-15 NOTE — Telephone Encounter (Signed)
Changing orders to future

## 2018-01-14 ENCOUNTER — Encounter

## 2018-01-14 LAB — COMPREHENSIVE METABOLIC PANEL
ALT: 36 U/L (ref 10–40)
AST: 30 U/L (ref 15–37)
Albumin/Globulin Ratio: 1.5 (ref 1.1–2.2)
Albumin: 4.4 g/dL (ref 3.4–5.0)
Alkaline Phosphatase: 61 U/L (ref 40–129)
Anion Gap: 14 (ref 3–16)
BUN: 15 mg/dL (ref 7–20)
CO2: 26 mmol/L (ref 21–32)
Calcium: 9.5 mg/dL (ref 8.3–10.6)
Chloride: 102 mmol/L (ref 99–110)
Creatinine: 0.8 mg/dL — ABNORMAL LOW (ref 0.9–1.3)
GFR African American: >60 (ref >60)
GFR Non-African American: >60 (ref >60)
Globulin: 3 g/dL
Glucose: 137 mg/dL — ABNORMAL HIGH (ref 70–99)
Potassium: 4.3 mmol/L (ref 3.5–5.1)
Sodium: 142 mmol/L (ref 136–145)
Total Bilirubin: 0.4 mg/dL (ref 0.0–1.0)
Total Protein: 7.4 g/dL (ref 6.4–8.2)

## 2018-01-14 LAB — VITAMIN D 25 HYDROXY: Vit D, 25-Hydroxy: 28.6 ng/mL — ABNORMAL LOW (ref >=30)

## 2018-01-14 LAB — CBC
Hematocrit: 48.1 % (ref 40.5–52.5)
Hemoglobin: 16.4 g/dL (ref 13.5–17.5)
MCH: 29 pg (ref 26.0–34.0)
MCHC: 34.1 g/dL (ref 31.0–36.0)
MCV: 85 fL (ref 80.0–100.0)
MPV: 8.7 fL (ref 5.0–10.5)
Platelets: 173 10*3/uL (ref 135–450)
RBC: 5.66 M/uL (ref 4.20–5.90)
RDW: 13.7 % (ref 12.4–15.4)
WBC: 5.7 10*3/uL (ref 4.0–11.0)

## 2018-01-14 LAB — VITAMIN B12: Vitamin B-12: 499 pg/mL (ref 211–911)

## 2018-01-14 LAB — LIPID PANEL
Cholesterol, Total: 172 mg/dL (ref 0–199)
HDL: 42 mg/dL (ref 40–60)
LDL Calculated: 103 mg/dL — ABNORMAL HIGH (ref <100)
Triglycerides: 136 mg/dL (ref 0–150)
VLDL Cholesterol Calculated: 27 mg/dL

## 2018-01-14 LAB — MAGNESIUM: Magnesium: 1.9 mg/dL (ref 1.80–2.40)

## 2018-01-14 LAB — TSH: TSH: 2.28 u[IU]/mL (ref 0.27–4.20)

## 2018-01-15 LAB — HEMOGLOBIN A1C
Hemoglobin A1C: 5.9 %
eAG: 122.6 mg/dL

## 2018-02-22 ENCOUNTER — Encounter

## 2018-02-22 MED ORDER — METFORMIN HCL ER 500 MG PO TB24
500 MG | ORAL_TABLET | ORAL | 1 refills | Status: DC
Start: 2018-02-22 — End: 2018-05-23

## 2018-05-12 ENCOUNTER — Encounter: Attending: Internal Medicine | Primary: Internal Medicine

## 2018-05-23 MED ORDER — METFORMIN HCL 500 MG PO TABS
500 MG | ORAL_TABLET | Freq: Two times a day (BID) | ORAL | 3 refills | Status: AC
Start: 2018-05-23 — End: ?

## 2018-05-23 MED ORDER — ACCU-CHEK AVIVA PLUS VI STRP
ORAL_STRIP | 3 refills | Status: AC
Start: 2018-05-23 — End: ?

## 2018-05-23 NOTE — Telephone Encounter (Signed)
[facility] in [place] calling b/c the metFORMIN (GLUCOPHAGE-XR) 500 MG extended release tablet is not available to them b/c of contamination issues. [facility] wants to know if dr Selena Batten wants to switch the pt to the immediate release form instead? If dr Selena Batten does then a new rx for the metformin immediate release needs to be sent to them.

## 2018-05-23 NOTE — Telephone Encounter (Signed)
Immediate relieve metformine 500mg  has to be taken 2 x daily instead of QD.

## 2018-07-07 ENCOUNTER — Encounter: Attending: Internal Medicine | Primary: Internal Medicine

## 2018-08-01 ENCOUNTER — Encounter: Attending: Internal Medicine | Primary: Internal Medicine

## 2018-09-09 ENCOUNTER — Encounter: Payer: BLUE CROSS/BLUE SHIELD | Attending: Internal Medicine | Primary: Internal Medicine

## 2018-09-19 ENCOUNTER — Encounter

## 2018-09-19 ENCOUNTER — Ambulatory Visit
Admit: 2018-09-19 | Discharge: 2018-09-19 | Payer: BLUE CROSS/BLUE SHIELD | Attending: Internal Medicine | Primary: Internal Medicine

## 2018-09-19 DIAGNOSIS — R7303 Prediabetes: Secondary | ICD-10-CM

## 2018-09-19 LAB — URINALYSIS
Bilirubin Urine: NEGATIVE
Blood, Urine: NEGATIVE
Glucose, Ur: NEGATIVE mg/dL
Ketones, Urine: NEGATIVE mg/dL
Leukocyte Esterase, Urine: NEGATIVE
Nitrite, Urine: NEGATIVE
Protein, UA: NEGATIVE mg/dL
Specific Gravity, UA: 1.019 (ref 1.005–1.030)
Urobilinogen, Urine: 0.2 E.U./dL (ref <2.0)
pH, UA: 5.5 (ref 5.0–8.0)

## 2018-09-19 LAB — CBC
Hematocrit: 47.8 % (ref 40.5–52.5)
Hemoglobin: 16 g/dL (ref 13.5–17.5)
MCH: 28.9 pg (ref 26.0–34.0)
MCHC: 33.6 g/dL (ref 31.0–36.0)
MCV: 86 fL (ref 80.0–100.0)
MPV: 8.6 fL (ref 5.0–10.5)
Platelets: 191 10*3/uL (ref 135–450)
RBC: 5.55 M/uL (ref 4.20–5.90)
RDW: 13.6 % (ref 12.4–15.4)
WBC: 6.5 10*3/uL (ref 4.0–11.0)

## 2018-09-19 LAB — COMPREHENSIVE METABOLIC PANEL
ALT: 34 U/L (ref 10–40)
AST: 28 U/L (ref 15–37)
Albumin/Globulin Ratio: 1.7 (ref 1.1–2.2)
Albumin: 4.5 g/dL (ref 3.4–5.0)
Alkaline Phosphatase: 66 U/L (ref 40–129)
Anion Gap: 11 (ref 3–16)
BUN: 14 mg/dL (ref 7–20)
CO2: 27 mmol/L (ref 21–32)
Calcium: 9.6 mg/dL (ref 8.3–10.6)
Chloride: 105 mmol/L (ref 99–110)
Creatinine: 1 mg/dL (ref 0.9–1.3)
GFR African American: >60 (ref >60)
GFR Non-African American: >60 (ref >60)
Globulin: 2.7 g/dL
Glucose: 128 mg/dL — ABNORMAL HIGH (ref 70–99)
Potassium: 4.6 mmol/L (ref 3.5–5.1)
Sodium: 143 mmol/L (ref 136–145)
Total Bilirubin: 0.3 mg/dL (ref 0.0–1.0)
Total Protein: 7.2 g/dL (ref 6.4–8.2)

## 2018-09-19 LAB — VITAMIN D 25 HYDROXY: Vit D, 25-Hydroxy: 38.8 ng/mL (ref >=30)

## 2018-09-19 LAB — LIPID PANEL
Cholesterol, Total: 173 mg/dL (ref 0–199)
HDL: 42 mg/dL (ref 40–60)
LDL Calculated: 109 mg/dL — ABNORMAL HIGH (ref <100)
Triglycerides: 112 mg/dL (ref 0–150)
VLDL Cholesterol Calculated: 22 mg/dL

## 2018-09-19 LAB — MICROALBUMIN / CREATININE URINE RATIO
Creatinine, Ur: 123.8 mg/dL (ref 39.0–259.0)
Microalbumin, Random Urine: <1.2 mg/dL (ref <2.0)

## 2018-09-19 NOTE — Progress Notes (Signed)
Subjective:      Patient ID: Bobby Hall is a 38 y.o. male.    Patient presents with:  Diabetes: 6 month check up, pt is fasting. req testosterone level, tired all time.     Bobby Hall is a 38 y.o. male with the following history as recorded in [employer]:  Patient Active Problem List    High triglycerides         Date Noted: 06/07/2014      GERD (gastroesophageal reflux disease)      Back pain         Date Noted: 09/20/2008      Current Outpatient Medications:  blood glucose test strips (ACCU-CHEK AVIVA PLUS) strip, TEST 1 TIMES A DAY & AS NEEDED FOR SYMPTOMS OF IRREGULAR BLOOD GLUCOSE. CONTOUR GLUCOMETER., Disp: 100 strip, Rfl: 3  metFORMIN (GLUCOPHAGE) 500 MG tablet, Take 1 tablet by mouth 2 times daily (with meals) (Patient taking differently: Take 500 mg by mouth daily ), Disp: 180 tablet, Rfl: 3  losartan (COZAAR) 25 MG tablet, Take 1 tablet by mouth daily, Disp: 90 tablet, Rfl: 3  Lancets MISC, Test once a day., Disp: 100 each, Rfl: 3  pantoprazole (PROTONIX) 40 MG tablet, Take 1 tablet by mouth daily, Disp: 90 tablet, Rfl: 3    No current facility-administered medications for this visit.     Allergies: Lipitor (atorvastatin) and Avelox (moxifloxacin hydrochloride)  Past Medical History:  09/20/2008: Back pain      Comment:  Workers Comp  No date: Chronic back pain      Comment:  Workers Comp  No date: GERD (gastroesophageal reflux disease)  No date: Herniated disc      Comment:  Workers Comp  06/07/2014: High triglycerides  38 years old: Pneumonia  No date: Pre-diabetes  Past Surgical History:  No date: BACK SURGERY      Comment:  Workers Comp  No date: FINGER SURGERY      Comment:  rt ring finger  Review of patient's family history indicates:  Problem: High Blood Pressure      Relation: Mother          Age of Onset: (Not Specified)  Problem: Diabetes      Relation: Mother          Age of Onset: (Not Specified)          Comment: borderline  Problem: Diabetes      Relation: Father          Age of Onset:  (Not Specified)  Problem: Heart Attack      Relation: Father          Age of Onset: (Not Specified)  Problem: Sudden Death      Relation: Sister          Age of Onset: (Not Specified)  Problem: Substance Abuse      Relation: Sister          Age of Onset: (Not Specified)    Social History    Tobacco Use      Smoking status: Never Smoker      Smokeless tobacco: Never Used    Alcohol use: No      Diabetes   He presents for his follow-up diabetic visit. He has type 2 diabetes mellitus. The initial diagnosis of diabetes was made 1 year ago. Hypoglycemia symptoms include headaches (mild. from tress.). Associated symptoms include fatigue. There are no hypoglycemic complications. Symptoms are stable. There are no diabetic complications. Risk  factors for coronary artery disease include diabetes mellitus, male sex, obesity and family history. Current diabetic treatment includes oral agent (monotherapy). His weight is stable. He is following a generally healthy diet. He participates in exercise intermittently. His breakfast blood glucose range is generally 110-130 mg/dl. An ACE inhibitor/angiotensin II receptor blocker is being taken. He does not see a podiatrist.Eye exam is not current.       Review of Systems   Constitutional: Positive for fatigue.   HENT: Negative.    Eyes: Negative.    Respiratory: Negative.    Cardiovascular: Negative.    Gastrointestinal:        GERD. On PPI.   Genitourinary: Negative.    Musculoskeletal: Positive for back pain.        Back surgery.   Skin: Negative.    Neurological: Positive for numbness and headaches (mild. from tress.).        Both upper arms, like sx of CTS.   Psychiatric/Behavioral: Negative.        Objective:   Physical Exam  Constitutional:       Appearance: He is well-developed.   HENT:      Head: Normocephalic and atraumatic.      Right Ear: External ear normal.      Left Ear: External ear normal.      Nose: Nose normal.   Eyes:      General: No scleral icterus.        Right  eye: No discharge.         Left eye: No discharge.      Conjunctiva/sclera: Conjunctivae normal.   Neck:      Musculoskeletal: Normal range of motion and neck supple.      Thyroid: No thyromegaly.      Vascular: No JVD.      Trachea: No tracheal deviation.   Cardiovascular:      Rate and Rhythm: Normal rate and regular rhythm.      Heart sounds: Normal heart sounds. No murmur. No friction rub. No gallop.    Pulmonary:      Effort: Pulmonary effort is normal. No respiratory distress.      Breath sounds: Normal breath sounds. No stridor. No wheezing or rales.   Chest:      Chest wall: No tenderness.   Abdominal:      General: Bowel sounds are normal. There is no distension.      Palpations: Abdomen is soft. There is no mass.      Tenderness: There is no abdominal tenderness. There is no guarding or rebound.   Genitourinary:     Penis: Normal. No tenderness.       Prostate: Normal.      Rectum: Normal. Guaiac result negative.   Musculoskeletal:         General: No tenderness.   Lymphadenopathy:      Cervical: No cervical adenopathy.   Skin:     General: Skin is warm and dry.      Coloration: Skin is not pale.      Findings: No erythema or rash.   Neurological:      Cranial Nerves: No cranial nerve deficit.      Motor: No abnormal muscle tone.      Coordination: Coordination normal.      Deep Tendon Reflexes: Reflexes are normal and symmetric. Reflexes normal.   Psychiatric:         Behavior: Behavior normal.         Thought  Content: Thought content normal.         Judgment: Judgment normal.         Assessment:      Encounter Diagnoses   Name Primary?   ??? Prediabetes Yes   ??? Hypogonadism male    ??? Hypovitaminosis D    ??? Bilateral carpal tunnel syndrome            Plan:      Bobby Hall was seen today for diabetes.    Diagnoses and all orders for this visit:    Prediabetes  -     Hemoglobin A1C; Future  -     Lipid Panel; Future  -     Comprehensive Metabolic Panel; Future  -     TSH without Reflex; Future  -     CBC;  Future  -     Microalbumin / Creatinine Urine Ratio; Future  -     URINALYSIS    Hypogonadism male  -     Testosterone, free, total; Future    Hypovitaminosis D  -     Vitamin D 25 Hydroxy; Future    Bilateral carpal tunnel syndrome              Brennan Bailey, MD

## 2018-09-19 NOTE — Addendum Note (Signed)
Addended by: Clide Dales on: 09/19/2018 09:24 AM     Modules accepted: Orders

## 2018-09-20 LAB — HEMOGLOBIN A1C
Hemoglobin A1C: 6 %
eAG: 125.5 mg/dL

## 2018-09-20 LAB — TSH: TSH: 2.64 u[IU]/mL (ref 0.27–4.20)

## 2018-09-21 LAB — TESTOSTERONE, FREE, TOTAL
Sex Hormone Binding: 19 nmol/L (ref 11–80)
Testosterone, Free: 83.3 pg/mL (ref 47–244)
Testosterone: 318 ng/dL (ref 220–1000)
